# Patient Record
Sex: Male | Born: 1965 | Race: Black or African American | Hispanic: No | Marital: Single | Smoking: Current every day smoker
Health system: Southern US, Community
[De-identification: ages and names within clinical notes are randomized; demographics above are authoritative.]

## PROBLEM LIST (undated history)

## (undated) DIAGNOSIS — M549 Dorsalgia, unspecified: Secondary | ICD-10-CM

## (undated) DIAGNOSIS — I639 Cerebral infarction, unspecified: Secondary | ICD-10-CM

## (undated) DIAGNOSIS — E039 Hypothyroidism, unspecified: Secondary | ICD-10-CM

## (undated) DIAGNOSIS — E079 Disorder of thyroid, unspecified: Secondary | ICD-10-CM

## (undated) HISTORY — PX: HEMORRHOID SURGERY: SHX153

---

## 2006-07-22 ENCOUNTER — Ambulatory Visit: Payer: Self-pay | Admitting: Family Medicine

## 2006-07-22 ENCOUNTER — Ambulatory Visit: Payer: Self-pay | Admitting: *Deleted

## 2006-10-25 DIAGNOSIS — K644 Residual hemorrhoidal skin tags: Secondary | ICD-10-CM | POA: Insufficient documentation

## 2006-10-25 DIAGNOSIS — G510 Bell's palsy: Secondary | ICD-10-CM

## 2006-10-25 DIAGNOSIS — R634 Abnormal weight loss: Secondary | ICD-10-CM

## 2006-12-22 ENCOUNTER — Emergency Department (HOSPITAL_COMMUNITY): Admission: EM | Admit: 2006-12-22 | Discharge: 2006-12-22 | Payer: Self-pay | Admitting: Emergency Medicine

## 2006-12-29 ENCOUNTER — Emergency Department (HOSPITAL_COMMUNITY): Admission: EM | Admit: 2006-12-29 | Discharge: 2006-12-29 | Payer: Self-pay | Admitting: Emergency Medicine

## 2007-03-02 ENCOUNTER — Encounter: Admission: RE | Admit: 2007-03-02 | Discharge: 2007-03-02 | Payer: Self-pay | Admitting: General Surgery

## 2007-03-03 ENCOUNTER — Ambulatory Visit (HOSPITAL_BASED_OUTPATIENT_CLINIC_OR_DEPARTMENT_OTHER): Admission: RE | Admit: 2007-03-03 | Discharge: 2007-03-03 | Payer: Self-pay | Admitting: General Surgery

## 2007-03-03 ENCOUNTER — Encounter (INDEPENDENT_AMBULATORY_CARE_PROVIDER_SITE_OTHER): Payer: Self-pay | Admitting: General Surgery

## 2007-08-03 ENCOUNTER — Encounter (HOSPITAL_COMMUNITY): Admission: RE | Admit: 2007-08-03 | Discharge: 2007-11-01 | Payer: Self-pay | Admitting: Internal Medicine

## 2008-02-08 ENCOUNTER — Emergency Department (HOSPITAL_COMMUNITY): Admission: EM | Admit: 2008-02-08 | Discharge: 2008-02-09 | Payer: Self-pay | Admitting: Emergency Medicine

## 2008-11-01 IMAGING — NM NM RAI THERAPY FOR HYPERTHYROIDISM
1 series · 1 of 1 positions shown · non-contrast
Comparison: Nuclear medicine thyroid scan and uptake 08/04/2007

CLINICAL DATA: Hyperthyroidism

NUCLEAR MEDICINE RADIOACTIVE IODINE THERAPY FOR HYPERTHYROIDISM
TECHNIQUE: The risks and benefits of radioactive iodine therapy
were discussed with the patient in detail. Alternative therapies
were also mentioned. Radiation safety was discussed with the
patient, including how to protect the general public from exposure.
There were no barriers to communication.  Written consent was
obtained.  The patient then received a capsule containing the
radiopharmaceutical.  The patient will follow-up with the referring
physician.
Radiopharmaceutical: 30.7 mCi N-4H4

[Series 1: st statics,dual det. · 2.35mm/px · 1 of 1 slices shown]
[im 1/1]
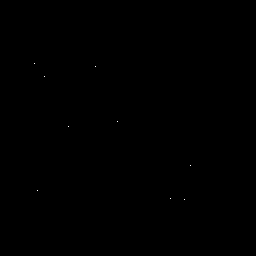

[1 of 1 positions shown; findings below may reference images not displayed]

FINDINGS: The patient received the capsule of N-4H4 after detailed
discussion of the treatment and precautions.
IMPRESSION: 1.  Treatment of hyperthyroidism with 30.7 mCi of N-4H4 orally.

## 2009-08-09 ENCOUNTER — Emergency Department (HOSPITAL_COMMUNITY): Admission: EM | Admit: 2009-08-09 | Discharge: 2009-08-09 | Payer: Self-pay | Admitting: Emergency Medicine

## 2010-01-27 ENCOUNTER — Encounter: Payer: Self-pay | Admitting: Internal Medicine

## 2010-02-13 ENCOUNTER — Emergency Department (HOSPITAL_COMMUNITY)
Admission: EM | Admit: 2010-02-13 | Discharge: 2010-02-13 | Disposition: A | Discharge status: Home / Self Care | Payer: Medicaid Other | Attending: Emergency Medicine | Admitting: Emergency Medicine

## 2010-02-13 ENCOUNTER — Emergency Department (HOSPITAL_COMMUNITY): Payer: Medicaid Other

## 2010-02-13 DIAGNOSIS — M545 Low back pain, unspecified: Secondary | ICD-10-CM | POA: Insufficient documentation

## 2010-02-13 DIAGNOSIS — X58XXXA Exposure to other specified factors, initial encounter: Secondary | ICD-10-CM | POA: Insufficient documentation

## 2010-02-13 DIAGNOSIS — G8929 Other chronic pain: Secondary | ICD-10-CM | POA: Insufficient documentation

## 2010-02-13 DIAGNOSIS — S335XXA Sprain of ligaments of lumbar spine, initial encounter: Secondary | ICD-10-CM | POA: Insufficient documentation

## 2010-02-13 LAB — URINALYSIS, ROUTINE W REFLEX MICROSCOPIC
Bilirubin Urine: NEGATIVE
Ketones, ur: NEGATIVE mg/dL
Nitrite: NEGATIVE
Protein, ur: NEGATIVE mg/dL

## 2010-03-21 LAB — POCT CARDIAC MARKERS
CKMB, poc: 1.8 ng/mL (ref 1.0–8.0)
Myoglobin, poc: 66.8 ng/mL (ref 12–200)
Troponin i, poc: <0.05 ng/mL (ref 0.00–0.09)

## 2010-03-21 LAB — POCT I-STAT, CHEM 8
Chloride: 109 mEq/L (ref 96–112)
Glucose, Bld: 97 mg/dL (ref 70–99)
Sodium: 139 mEq/L (ref 135–145)
TCO2: 19 mmol/L (ref 0–100)

## 2010-04-01 ENCOUNTER — Emergency Department (HOSPITAL_COMMUNITY): Payer: Medicaid Other

## 2010-04-01 ENCOUNTER — Emergency Department (HOSPITAL_COMMUNITY)
Admission: EM | Admit: 2010-04-01 | Discharge: 2010-04-01 | Disposition: A | Discharge status: Home / Self Care | Payer: Medicaid Other | Attending: Emergency Medicine | Admitting: Emergency Medicine

## 2010-04-01 DIAGNOSIS — M79609 Pain in unspecified limb: Secondary | ICD-10-CM | POA: Insufficient documentation

## 2010-04-01 DIAGNOSIS — M62838 Other muscle spasm: Secondary | ICD-10-CM | POA: Insufficient documentation

## 2010-04-01 DIAGNOSIS — R252 Cramp and spasm: Secondary | ICD-10-CM | POA: Insufficient documentation

## 2010-04-01 DIAGNOSIS — M545 Low back pain, unspecified: Secondary | ICD-10-CM | POA: Insufficient documentation

## 2010-04-01 DIAGNOSIS — R209 Unspecified disturbances of skin sensation: Secondary | ICD-10-CM | POA: Insufficient documentation

## 2010-04-02 ENCOUNTER — Emergency Department (HOSPITAL_COMMUNITY): Payer: Medicaid Other

## 2010-04-02 ENCOUNTER — Emergency Department (HOSPITAL_COMMUNITY)
Admission: EM | Admit: 2010-04-02 | Discharge: 2010-04-02 | Disposition: A | Discharge status: Home / Self Care | Payer: Medicaid Other | Attending: Emergency Medicine | Admitting: Emergency Medicine

## 2010-04-02 DIAGNOSIS — M79609 Pain in unspecified limb: Secondary | ICD-10-CM | POA: Insufficient documentation

## 2010-04-02 DIAGNOSIS — R209 Unspecified disturbances of skin sensation: Secondary | ICD-10-CM | POA: Insufficient documentation

## 2010-04-02 DIAGNOSIS — M545 Low back pain, unspecified: Secondary | ICD-10-CM | POA: Insufficient documentation

## 2010-04-02 DIAGNOSIS — IMO0002 Reserved for concepts with insufficient information to code with codable children: Secondary | ICD-10-CM | POA: Insufficient documentation

## 2010-04-22 LAB — BASIC METABOLIC PANEL
BUN: 12 mg/dL (ref 6–23)
Calcium: 10 mg/dL (ref 8.4–10.5)
Chloride: 101 mEq/L (ref 96–112)
GFR calc Af Amer: 60 mL/min — ABNORMAL LOW (ref >60)
Potassium: 4.1 mEq/L (ref 3.5–5.1)
Sodium: 138 mEq/L (ref 135–145)

## 2010-04-22 LAB — DIFFERENTIAL
Basophils Absolute: 0.1 10*3/uL (ref 0.0–0.1)
Eosinophils Absolute: 0.1 10*3/uL (ref 0.0–0.7)
Eosinophils Relative: 3 % (ref 0–5)
Lymphs Abs: 2.4 10*3/uL (ref 0.7–4.0)
Monocytes Absolute: 0.3 10*3/uL (ref 0.1–1.0)
Neutro Abs: 2.1 10*3/uL (ref 1.7–7.7)

## 2010-04-22 LAB — CBC
HCT: 44.2 % (ref 39.0–52.0)
MCHC: 33.6 g/dL (ref 30.0–36.0)
MCV: 92 fL (ref 78.0–100.0)
RDW: 15.3 % (ref 11.5–15.5)

## 2010-04-22 LAB — RAPID URINE DRUG SCREEN, HOSP PERFORMED
Barbiturates: NOT DETECTED
Opiates: NOT DETECTED

## 2010-04-22 LAB — ETHANOL: Alcohol, Ethyl (B): <5 mg/dL (ref 0–10)

## 2010-05-07 ENCOUNTER — Other Ambulatory Visit: Payer: Self-pay | Admitting: Neurosurgery

## 2010-05-07 DIAGNOSIS — M5126 Other intervertebral disc displacement, lumbar region: Secondary | ICD-10-CM

## 2010-05-07 DIAGNOSIS — M5416 Radiculopathy, lumbar region: Secondary | ICD-10-CM

## 2010-05-08 ENCOUNTER — Ambulatory Visit
Admission: RE | Admit: 2010-05-08 | Discharge: 2010-05-08 | Disposition: A | Discharge status: Home / Self Care | Payer: Medicaid Other | Source: Ambulatory Visit | Attending: Neurosurgery | Admitting: Neurosurgery

## 2010-05-08 DIAGNOSIS — M5416 Radiculopathy, lumbar region: Secondary | ICD-10-CM

## 2010-05-08 DIAGNOSIS — M5126 Other intervertebral disc displacement, lumbar region: Secondary | ICD-10-CM

## 2010-05-20 NOTE — Op Note (Signed)
NAMEWORTHINGTON, CRUZAN NO.:  [id number]   MEDICAL RECORD NO.:  [id number]          PATIENT TYPE:  AMB   LOCATION:  [facility]                         FACILITY:  [facility]   PHYSICIAN:  Anselm Pancoast. Weatherly, M.D.DATE OF BIRTH:  September 09, 1965   DATE OF PROCEDURE:  03/03/2007  DATE OF DISCHARGE:                               OPERATIVE REPORT   PREOPERATIVE DIAGNOSIS:  Prolapsing internal and external hemorrhoids.   OPERATION:  Internal and external hemorrhoidectomy and proctoscopy.   ANESTHESIA:  General.   POSITION:  Lithotomy.   HISTORY:  George Floyd is a 45 year old black male who has got bad  prolapsing internal and external hemorrhoids that bleed and are quite  painful.  He was seen in the office.  He has never had a colon exam, but  his hemorrhoids are enormous and chronically prolapsed and I recommend  that we do a proctoscopic exam and internal and external  hemorrhoidectomy and he is here for the planned procedure.  He is not  anemic.  He has got no other chronic medical problems and preoperatively  he was given a gram of Ancef.  Permit signed.  The patient had taken a  laxative last night, but had not got good results and I got them to give  him a Fleets enema prior to going back to the OR.  He said he got all of  the stew out with the Fleets enema.   DESCRIPTION OF PROCEDURE:  The patient was first placed in induction,  LAO tube, and then placed up in the lithotomy position and on rectal  exam there was no other stool in the rectum.  He was extremely  constipated when he was seen in the office.  I then did a proctoscopic  exam to 25 cm.  No abnormalities noted with the exception of the  internal and external hemorrhoids.  He was then prepped with Betadine  solution, draped in a sterile manner.  I anesthetized both the left and  right posterior area of the sphincter with 10 mL of 0.25 Marcaine with  adrenaline.  Then with the patient in a drape, I elected to go  ahead and  remove the anterior hemorrhoid that was to the right of midline first  and elevate it off of the internal sphincter in an external area of the  skin, and the underlying hemorrhoid was clamped with a Bowie clamp and  the hemorrhoid removed.  I then used about three 2-0 chromic sutures for  suturing the pedicle and clamped and then started at the apex, new 2-0  and then did a running suture removing the clip.  The Bowie clamp out to  the approximately first third and pulling everything snug.  Then with  the suture line completely closed, then went to the right posterolateral  next.  It was much larger than the first, elevating on the pedicle off  the sphincter, a few little sutures with 3-0 chromic on the external  portion of the communication, and then the similar 2-0 sutures on the  pedicle itself.   Next the left lateral was then  last.  There was 1 little hemorrhoid.  It  went to the midline posterior and I included this with it and then  suturing the pedicle with the 2-0 Vicryls, and then a running 2-0 Vicryl  for closure of the areas.  I did use a little bit of a T of the external  skin on the left lateral to keep the knots close to the anus, but also  to close the little area.  The inspection of all 3 suture lines was next  done with a small anoscope.  There was no evidence of bleeding, and then  I used 5% Xylocaine ointment in the anal canal, held in place with an  open 4 x 4 and then dressings held in place with a little stretch  bandage.   The patient tolerated the procedure well.  Hopefully he will not have  any trouble voiding and will be discharged with pain medication,  Vicodin, and also the Xylocaine ointment.  I will see him back in the  office in approximately  10 days.  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  All the specimens were sent for pathology  exam.  They  all looked just like chronic large hemorrhoids, nothing that looks  worrisome.           ______________________________  Anselm Pancoast. Zachery Dakins, M.D.     WJW/MEDQ  D:  03/03/2007  T:  03/03/2007  Job:  [id number]

## 2010-09-26 LAB — COMPREHENSIVE METABOLIC PANEL
AST: 19
Albumin: 3.8
Alkaline Phosphatase: 41
BUN: 3 — ABNORMAL LOW
CO2: 26
Chloride: 108
Creatinine, Ser: 0.92
GFR calc non Af Amer: >60
Glucose, Bld: 132 — ABNORMAL HIGH
Total Protein: 6.4

## 2010-09-26 LAB — DIFFERENTIAL
Basophils Relative: 1
Eosinophils Absolute: 0.1
Eosinophils Relative: 2
Lymphocytes Relative: 52 — ABNORMAL HIGH
Lymphs Abs: 2.2
Monocytes Absolute: 0.4
Monocytes Relative: 9
Neutro Abs: 1.6 — ABNORMAL LOW

## 2010-09-26 LAB — CBC
MCV: 90
WBC: 4.3

## 2012-04-12 ENCOUNTER — Emergency Department (HOSPITAL_COMMUNITY)
Admission: EM | Admit: 2012-04-12 | Discharge: 2012-04-12 | Disposition: A | Discharge status: Home / Self Care | Payer: Medicaid Other | Attending: Emergency Medicine | Admitting: Emergency Medicine

## 2012-04-12 ENCOUNTER — Encounter (HOSPITAL_COMMUNITY): Payer: Self-pay | Admitting: Emergency Medicine

## 2012-04-12 ENCOUNTER — Emergency Department (HOSPITAL_COMMUNITY): Payer: Medicaid Other

## 2012-04-12 DIAGNOSIS — Z8639 Personal history of other endocrine, nutritional and metabolic disease: Secondary | ICD-10-CM | POA: Insufficient documentation

## 2012-04-12 DIAGNOSIS — R112 Nausea with vomiting, unspecified: Secondary | ICD-10-CM | POA: Insufficient documentation

## 2012-04-12 DIAGNOSIS — N201 Calculus of ureter: Secondary | ICD-10-CM | POA: Insufficient documentation

## 2012-04-12 DIAGNOSIS — K5900 Constipation, unspecified: Secondary | ICD-10-CM | POA: Insufficient documentation

## 2012-04-12 DIAGNOSIS — F17200 Nicotine dependence, unspecified, uncomplicated: Secondary | ICD-10-CM | POA: Insufficient documentation

## 2012-04-12 DIAGNOSIS — N1330 Unspecified hydronephrosis: Secondary | ICD-10-CM | POA: Insufficient documentation

## 2012-04-12 DIAGNOSIS — N132 Hydronephrosis with renal and ureteral calculous obstruction: Secondary | ICD-10-CM

## 2012-04-12 DIAGNOSIS — Z862 Personal history of diseases of the blood and blood-forming organs and certain disorders involving the immune mechanism: Secondary | ICD-10-CM | POA: Insufficient documentation

## 2012-04-12 DIAGNOSIS — R10817 Generalized abdominal tenderness: Secondary | ICD-10-CM | POA: Insufficient documentation

## 2012-04-12 HISTORY — DX: Disorder of thyroid, unspecified: E07.9

## 2012-04-12 LAB — COMPREHENSIVE METABOLIC PANEL
ALT: 18 U/L (ref 0–53)
AST: 27 U/L (ref 0–37)
CO2: 25 mEq/L (ref 19–32)
GFR calc Af Amer: 76 mL/min — ABNORMAL LOW (ref >90)
Potassium: 3.9 mEq/L (ref 3.5–5.1)
Total Bilirubin: 0.4 mg/dL (ref 0.3–1.2)
Total Protein: 7 g/dL (ref 6.0–8.3)

## 2012-04-12 LAB — CBC WITH DIFFERENTIAL/PLATELET
Eosinophils Absolute: 0.1 10*3/uL (ref 0.0–0.7)
Eosinophils Relative: 2 % (ref 0–5)
Lymphs Abs: 1.5 10*3/uL (ref 0.7–4.0)
Neutro Abs: 4.6 10*3/uL (ref 1.7–7.7)
Platelets: 228 10*3/uL (ref 150–400)
RBC: 3.99 MIL/uL — ABNORMAL LOW (ref 4.22–5.81)
RDW: 15.7 % — ABNORMAL HIGH (ref 11.5–15.5)
WBC: 6.8 10*3/uL (ref 4.0–10.5)

## 2012-04-12 LAB — URINALYSIS, MICROSCOPIC ONLY
Protein, ur: 30 mg/dL — AB
Specific Gravity, Urine: 1.029 (ref 1.005–1.030)

## 2012-04-12 LAB — LIPASE, BLOOD: Lipase: 45 U/L (ref 11–59)

## 2012-04-12 MED ORDER — TAMSULOSIN HCL 0.4 MG PO CAPS: 0.4000 mg | ORAL_CAPSULE | Freq: Every day | ORAL | Status: DC

## 2012-04-12 MED ORDER — ONDANSETRON 4 MG PO TBDP
4.0000 mg | ORAL_TABLET | Freq: Once | ORAL | Status: AC
  Administered 2012-04-12: 4 mg via ORAL
  Filled 2012-04-12: qty 1

## 2012-04-12 MED ORDER — OXYCODONE-ACETAMINOPHEN 5-325 MG PO TABS: 1.0000 | ORAL_TABLET | ORAL | Status: DC | PRN

## 2012-04-12 MED ORDER — SODIUM CHLORIDE 0.9 % IV BOLUS (SEPSIS)
1000.0000 mL | Freq: Once | INTRAVENOUS | Status: AC
  Administered 2012-04-12: 1000 mL via INTRAVENOUS

## 2012-04-12 MED ORDER — ONDANSETRON HCL 4 MG PO TABS: 4.0000 mg | ORAL_TABLET | Freq: Four times a day (QID) | ORAL | Status: DC

## 2012-04-12 MED ORDER — HYDROMORPHONE HCL PF 1 MG/ML IJ SOLN
1.0000 mg | Freq: Once | INTRAMUSCULAR | Status: AC
  Administered 2012-04-12: 1 mg via INTRAVENOUS
  Filled 2012-04-12: qty 1

## 2012-04-12 NOTE — ED Notes (Signed)
Pt states that he has not been able to have a BM in 2 days.  C/o rt sided abd pain.  Pt is moaning and grunting in pain during triage.

## 2012-04-12 NOTE — ED Provider Notes (Signed)
History     CSN: [id number]  Arrival date & time 04/12/12  0706   First MD Initiated Contact with Patient 04/12/12 [phone]      Chief Complaint  Patient presents with  . Constipation  . Abdominal Pain    (Consider location/radiation/quality/duration/timing/severity/associated sxs/prior treatment) HPI  Patient presents to the ED with complaints of severe diffuse abdominal pain that is worse in the RLQ. It started acutely this morning. He has not had a bowel movement in two days and has been able to pass a small amount of gas. The pain is constant and a "spasming" quality causing him to not be able to lay flat. He had one episode of vomiting this morning after taking a stool softner. He has never had this kind of pain before, no previous abdominal surgeries, no fevers, diarrhea, weakness, recent illness, CP, SOB, rectal bleeding, hematuria, testicular pain. vss  Past Medical History  Diagnosis Date  . Thyroid disease     History reviewed. No pertinent past surgical history.  History reviewed. No pertinent family history.  History  Substance Use Topics  . Smoking status: Current Every Day Smoker -- 0.50 packs/day    Types: Cigarettes  . Smokeless tobacco: Not on file  . Alcohol Use: No      Review of Systems  Review of Systems  Gen: no weight loss, fevers, chills, night sweats  Eyes: no discharge or drainage, no occular pain or visual changes  Nose: no epistaxis or rhinorrhea  Mouth: no dental pain, no sore throat  Neck: no neck pain  Lungs:No wheezing, coughing or hemoptysis CV: no chest pain, palpitations, dependent edema or orthopnea  Abd: + abdominal pain, nausea, vomiting  GU: no dysuria or gross hematuria  MSK:  No abnormalities  Neuro: no headache, no focal neurologic deficits  Skin: no abnormalities Psyche: negative.   Allergies  Review of patient's allergies indicates no known allergies.  Home Medications   Current Outpatient Rx  Name  Route  Sig   Dispense  Refill  . ondansetron (ZOFRAN) 4 MG tablet   Oral   Take 1 tablet (4 mg total) by mouth every 6 (six) hours.   12 tablet   0   . oxyCODONE-acetaminophen (PERCOCET/ROXICET) 5-325 MG per tablet   Oral   Take 1 tablet by mouth every 4 (four) hours as needed for pain.   20 tablet   0   . tamsulosin (FLOMAX) 0.4 MG CAPS   Oral   Take 1 capsule (0.4 mg total) by mouth daily.   14 capsule   0     BP 125/62  Pulse 50  Temp(Src) 97.7 F (36.5 C) (Oral)  Resp 18  SpO2 100%  Physical Exam  Nursing note and vitals reviewed. Constitutional: He appears well-developed and well-nourished. No distress.  HENT:  Head: Normocephalic and atraumatic.  Eyes: Pupils are equal, round, and reactive to light.  Neck: Normal range of motion. Neck supple.  Cardiovascular: Normal rate and regular rhythm.   Pulmonary/Chest: Effort normal.  Abdominal: Soft. He exhibits no shifting dullness, no distension, no pulsatile liver, no fluid wave and no ascites. There is tenderness (diffuse abdominal pain that is worse RLQ ). There is guarding (invuluntary guarding). There is no rigidity.  Neurological: He is alert.  Skin: Skin is warm and dry.    ED Course  Procedures (including critical care time)  Labs Reviewed  CBC WITH DIFFERENTIAL - Abnormal; Notable for the following:    RBC 3.99 (*)  Hemoglobin 12.1 (*)    HCT 35.0 (*)    RDW 15.7 (*)    All other components within normal limits  COMPREHENSIVE METABOLIC PANEL - Abnormal; Notable for the following:    Glucose, Bld 116 (*)    Alkaline Phosphatase 33 (*)    GFR calc non Af Amer 66 (*)    GFR calc Af Amer 76 (*)    All other components within normal limits  URINALYSIS, MICROSCOPIC ONLY - Abnormal; Notable for the following:    Hgb urine dipstick MODERATE (*)    Protein, ur 30 (*)    Leukocytes, UA SMALL (*)    Crystals CA OXALATE CRYSTALS (*)    All other components within normal limits  URINE CULTURE  LIPASE, BLOOD   Ct  Abdomen Pelvis Wo Contrast  04/12/2012  *RADIOLOGY REPORT*  Clinical Data: Constipation and abdominal pain.  Right lateral abdominal pain  CT ABDOMEN AND PELVIS WITHOUT CONTRAST  Technique:  Multidetector CT imaging of the abdomen and pelvis was performed following the standard protocol without intravenous contrast.  Comparison: Abdominal series 04/12/2012  Findings: Lung bases demonstrate mild atelectasis.  Unenhanced CT was performed per clinician order.  Lack of IV contrast limits sensitivity and specificity, especially for evaluation of abdominal/pelvic solid viscera.  No gross abnormality to the liver, gallbladder, spleen, adrenal glands or pancreas.  There is fullness of the left renal pelvis and suggestion for a bifid collecting system.  However, this probably represents an extrarenal pelvis rather than hydronephrosis.  Subtle medullary densities in the left kidney without stones.  There may be a punctate stone within the right kidney lower pole.  There is dilatation of the right renal collecting system including the calyces.  The right ureter is dilated due to a large stone in the distal right ureter.  The stone measures up to 9 mm on the coronal images.  Limited evaluation of the urinary bladder. No gross abnormality to the prostate or seminal vesicles.  No significant free fluid or lymphadenopathy.  The bone images demonstrate areas of subchondral sclerosis involving the femoral heads, right side greater left.  Findings could represent changes from avascular necrosis.  No evidence for femoral head collapse.  Probable disc bulge at L3-4.  IMPRESSION: Moderate-severe right hydroureteronephrosis due to a 9 mm stone in the distal right ureter.  Punctate stone in the right kidney lower pole.  Areas of sclerosis involving the femoral heads are concerning for avascular necrosis.   Original Report Authenticated By: Richarda Overlie, M.D.    Dg Abd Acute W/chest  04/12/2012  *RADIOLOGY REPORT*  Clinical Data:  Constipation, abdominal pain, nausea/vomiting  ACUTE ABDOMEN SERIES (ABDOMEN 2 VIEW & CHEST 1 VIEW)  Comparison: 08/09/2009  Findings: Lungs are clear. No pleural effusion or pneumothorax.  Cardiomediastinal silhouette is within normal limits.  Nonspecific bowel gas pattern, but without disproportionate small bowel dilatation to suggest small bowel obstruction.  No evidence of free air under the diaphragm on the upright view.  Moderate stool in the right colon.  IMPRESSION: No evidence of acute cardiopulmonary disease.  No evidence of small bowel obstruction or free air.  Moderate stool in the right colon.   Original Report Authenticated By: Charline Bills, M.D.      1. Ureteral stone with hydronephrosis       MDM  Patient has been seen by Dr. Hyacinth Meeker as well. His urine shows moderate hbg. Will order CT abd/pelv wo contrast.  CT abd/pelv shows a 9 mm stone to the  distal right ureter with mod/severe right hydroureterernephrosis in the distal right ureter.   CT also notes possible avascular necrosis involving femoral heads. Patient not having symptoms consistent with this but will give referral to Ortho.   Consult placed to Urology.  He wants to see pt in his office for follow-up. Pt given referral. Discussed plan with patient and his aunt.  Will rx: urine strainer, pain medication, flomax and zofran. No UTI/    Pt has been advised of the symptoms that warrant their return to the ED. Patient has voiced understanding and has agreed to follow-up with the PCP or specialist.      Dorthula Matas, PA-C 04/12/12 [phone]

## 2012-04-12 NOTE — ED Provider Notes (Signed)
47 year old male, history of thyroid disorder, presents with 2 days of abdominal pain, decreased bowel movements and decreased urination. On exam the patient has diffuse abdominal pain, mild guarding, abdomen is non-peritoneal, no focal tenderness, pain in both right and left lower quadrants, right and left upper quadrants and periumbilical area. He denies a history of using over-the-counter medication such as NSAIDs or aspirin-containing products  Acute abdominal series to rule out free air, stool burden, CT scan may be necessary and no obvious source, labs, urine   Medical screening examination/treatment/procedure(s) were conducted as a shared visit with non-physician practitioner(s) and myself.  I personally evaluated the patient during the encounter    Vida Roller, MD 04/12/12 [phone]

## 2012-04-12 NOTE — ED Provider Notes (Signed)
Medical screening examination/treatment/procedure(s) were conducted as a shared visit with non-physician practitioner(s) and myself.  I personally evaluated the patient during the encounter  Please see my separate respective documentation pertaining to this patient encounter   Vida Roller, MD 04/12/12 [phone]

## 2012-04-13 LAB — URINE CULTURE: Colony Count: NO GROWTH

## 2012-04-14 ENCOUNTER — Emergency Department (HOSPITAL_COMMUNITY)
Admission: EM | Admit: 2012-04-14 | Discharge: 2012-04-14 | Disposition: A | Discharge status: Home / Self Care | Payer: Medicaid Other | Attending: Emergency Medicine | Admitting: Emergency Medicine

## 2012-04-14 ENCOUNTER — Encounter (HOSPITAL_COMMUNITY): Payer: Self-pay | Admitting: *Deleted

## 2012-04-14 DIAGNOSIS — Z862 Personal history of diseases of the blood and blood-forming organs and certain disorders involving the immune mechanism: Secondary | ICD-10-CM | POA: Insufficient documentation

## 2012-04-14 DIAGNOSIS — K5900 Constipation, unspecified: Secondary | ICD-10-CM | POA: Insufficient documentation

## 2012-04-14 DIAGNOSIS — Z8673 Personal history of transient ischemic attack (TIA), and cerebral infarction without residual deficits: Secondary | ICD-10-CM | POA: Insufficient documentation

## 2012-04-14 DIAGNOSIS — Z8639 Personal history of other endocrine, nutritional and metabolic disease: Secondary | ICD-10-CM | POA: Insufficient documentation

## 2012-04-14 DIAGNOSIS — N2889 Other specified disorders of kidney and ureter: Secondary | ICD-10-CM | POA: Insufficient documentation

## 2012-04-14 DIAGNOSIS — N200 Calculus of kidney: Secondary | ICD-10-CM

## 2012-04-14 DIAGNOSIS — F17200 Nicotine dependence, unspecified, uncomplicated: Secondary | ICD-10-CM | POA: Insufficient documentation

## 2012-04-14 DIAGNOSIS — N138 Other obstructive and reflux uropathy: Secondary | ICD-10-CM

## 2012-04-14 LAB — BASIC METABOLIC PANEL
CO2: 26 mEq/L (ref 19–32)
Chloride: 101 mEq/L (ref 96–112)
Creatinine, Ser: 1.9 mg/dL — ABNORMAL HIGH (ref 0.50–1.35)

## 2012-04-14 LAB — CBC
MCV: 87.9 fL (ref 78.0–100.0)
Platelets: 267 10*3/uL (ref 150–400)
RBC: 3.8 MIL/uL — ABNORMAL LOW (ref 4.22–5.81)
WBC: 5.3 10*3/uL (ref 4.0–10.5)

## 2012-04-14 MED ORDER — HYDROMORPHONE HCL PF 1 MG/ML IJ SOLN
1.0000 mg | Freq: Once | INTRAMUSCULAR | Status: AC
  Administered 2012-04-14: 1 mg via INTRAVENOUS
  Filled 2012-04-14: qty 1

## 2012-04-14 MED ORDER — POLYETHYLENE GLYCOL 3350 17 G PO PACK: 17.0000 g | PACK | Freq: Every day | ORAL | Status: DC | PRN

## 2012-04-14 MED ORDER — POLYETHYLENE GLYCOL 3350 17 G PO PACK
17.0000 g | PACK | Freq: Every day | ORAL | Status: DC
  Administered 2012-04-14: 17 g via ORAL
  Filled 2012-04-14 (×2): qty 1

## 2012-04-14 MED ORDER — OXYCODONE-ACETAMINOPHEN 5-325 MG PO TABS: 2.0000 | ORAL_TABLET | ORAL | Status: DC | PRN

## 2012-04-14 MED ORDER — ACETAMINOPHEN 500 MG PO TABS
1000.0000 mg | ORAL_TABLET | Freq: Once | ORAL | Status: AC
  Administered 2012-04-14: 1000 mg via ORAL
  Filled 2012-04-14: qty 2

## 2012-04-14 NOTE — ED Notes (Signed)
Pt given urinal aware urine sample is needed

## 2012-04-14 NOTE — ED Notes (Signed)
Pharmacy called, still waiting on Miralax.

## 2012-04-14 NOTE — ED Notes (Signed)
[id number]<AV> Expected date:<BR> Expected time:<BR> Means of arrival:<BR> Comments:<BR> Tr 7

## 2012-04-14 NOTE — ED Notes (Signed)
Pt was seen in Ed on Monday. Pt was dx with kidney stones. Pt now c/o right sided flank pain. Pt states he has been taking medication to help relieve pain to no avail.

## 2012-04-14 NOTE — ED Provider Notes (Signed)
History     CSN: [id number]  Arrival date & time 04/14/12  1950   None     Chief Complaint  Patient presents with  . Flank Pain    (Consider location/radiation/quality/duration/timing/severity/associated sxs/prior treatment) HPI 47 yo M with thyroid disease presenting with right flank pain. He was seen in the ED on Monday and found to have a 9mm right sided kidney stone.  He was discharged with percocet, flomax, and zofran.  He reports taking the medication as prescribed, although he does not know the names of the medicines, with no improvement in the pain.  He also complains of constipation, did have a runny BM yesterday, but none today.  Denies dysuria, urgency, frequency.  No fevers, chills, abdominal pain.  No nausea or vomiting.  He reports that he called urology and has an appt with them next Thursday.  Past Medical History  Diagnosis Date  . Thyroid disease     History reviewed. No pertinent past surgical history.  History reviewed. No pertinent family history.  History  Substance Use Topics  . Smoking status: Current Every Day Smoker -- 0.50 packs/day    Types: Cigarettes  . Smokeless tobacco: Not on file  . Alcohol Use: No      Review of Systems  Gastrointestinal: Positive for constipation.  Genitourinary: Positive for flank pain (right).  All other systems reviewed and are negative.    Allergies  Review of patient's allergies indicates no known allergies.  Home Medications   Current Outpatient Rx  Name  Route  Sig  Dispense  Refill  . ondansetron (ZOFRAN) 4 MG tablet   Oral   Take 1 tablet (4 mg total) by mouth every 6 (six) hours.   12 tablet   0   . oxyCODONE-acetaminophen (PERCOCET/ROXICET) 5-325 MG per tablet   Oral   Take 1 tablet by mouth every 4 (four) hours as needed for pain.   20 tablet   0   . tamsulosin (FLOMAX) 0.4 MG CAPS   Oral   Take 1 capsule (0.4 mg total) by mouth daily.   14 capsule   0     BP 128/81  Pulse 72   Temp(Src) 97.6 F (36.4 C) (Oral)  Resp 16  SpO2 100%  Physical Exam  Constitutional: He is oriented to person, place, and time. He appears well-developed and well-nourished. He appears distressed (unable to lie still on bed).  HENT:  Head: Normocephalic and atraumatic.  Eyes: No scleral icterus.  Neck: Normal range of motion. Neck supple.  Cardiovascular: Normal rate, regular rhythm, normal heart sounds and intact distal pulses.   No murmur heard. Pulmonary/Chest: Effort normal and breath sounds normal. No respiratory distress. He has no wheezes. He has no rales.  Abdominal: Soft. Bowel sounds are normal. He exhibits no distension. There is tenderness (RLQ). There is no rebound and no guarding.  Musculoskeletal: He exhibits no edema.  Neurological: He is alert and oriented to person, place, and time. He exhibits normal muscle tone.  Skin: Skin is warm and dry. No rash noted. He is not diaphoretic. No erythema.    ED Course  Procedures (including critical care time)  Labs Reviewed  CBC - Abnormal; Notable for the following:    RBC 3.80 (*)    Hemoglobin 11.5 (*)    HCT 33.4 (*)    RDW 15.9 (*)    All other components within normal limits  BASIC METABOLIC PANEL - Abnormal; Notable for the following:    Creatinine,  Ser 1.90 (*)    Calcium 8.3 (*)    GFR calc non Af Amer 40 (*)    GFR calc Af Amer 47 (*)    All other components within normal limits  URINALYSIS, ROUTINE W REFLEX MICROSCOPIC   No results found.   No diagnosis found.    MDM  47 yo M with thyroid disease and known right 9mm kidney stone diagnosed by CT on Monday.  Poor pain control at home with percocet.  Will check cbc and bmp.  No signs to indicate developing urinary infection but will check ua.  Will give dilaudid 1mg  IV.   9:14 PM: BMP shows acute elevation of creatinine to 1.90.  Will consult urology.  Discussed with Dr. Vernie Ammons who recommends pain control with narcotics and tylenol.  If pain can be  controlled okay to discharge with f/u in Urology clinic tomorrow.  9:36 PM: Discussed results extensively with both the patient and his aunt Kathie Rhodes.  They are both very concerned about constipation and some swelling around his eyes.  I explained that the pain in his abdomen is from the kidney stone, NOT constipation.  Similarly, the swelling is from the kidney stone causing some difficulty with fluid removal.  Explained that we need to treat the kidney stone and the other concerns should improve on their own.  Given the fixation on constipation, will give some miralax.  Pain was improved to a 5/10 with dilaudid 1mg . Will give another 1mg  of dilaudid.   Plan to discharge on flomax and with percocet 5-325mg  take 2 tablets q4hr overnight.  Office information provided in discharge paperwork.  Emphasized importance of seeing Urology tomorrow.  Phebe Colla, MD 04/14/12 2152

## 2012-04-14 NOTE — ED Provider Notes (Signed)
I saw and evaluated the patient, reviewed the resident's note and I agree with the findings and plan.   .Face to face Exam:  General:  Awake HEENT:  Atraumatic Resp:  Normal effort Abd:  Nondistended Neuro:No focal weakness   Nelia Shi, MD 04/14/12 2206

## 2012-04-15 ENCOUNTER — Encounter (HOSPITAL_COMMUNITY): Payer: Self-pay | Admitting: *Deleted

## 2012-04-15 ENCOUNTER — Encounter (HOSPITAL_COMMUNITY)
Admission: AD | Disposition: A | Discharge status: Home / Self Care | Payer: Self-pay | Source: Ambulatory Visit | Attending: Urology

## 2012-04-15 ENCOUNTER — Other Ambulatory Visit: Payer: Self-pay | Admitting: Urology

## 2012-04-15 ENCOUNTER — Ambulatory Visit (HOSPITAL_COMMUNITY): Payer: Medicaid Other | Admitting: *Deleted

## 2012-04-15 ENCOUNTER — Ambulatory Visit (HOSPITAL_COMMUNITY)
Admission: AD | Admit: 2012-04-15 | Discharge: 2012-04-15 | Disposition: A | Discharge status: Home / Self Care | Payer: Medicaid Other | Source: Ambulatory Visit | Attending: Urology | Admitting: Urology

## 2012-04-15 DIAGNOSIS — F17200 Nicotine dependence, unspecified, uncomplicated: Secondary | ICD-10-CM | POA: Insufficient documentation

## 2012-04-15 DIAGNOSIS — I739 Peripheral vascular disease, unspecified: Secondary | ICD-10-CM | POA: Insufficient documentation

## 2012-04-15 DIAGNOSIS — N201 Calculus of ureter: Secondary | ICD-10-CM | POA: Insufficient documentation

## 2012-04-15 DIAGNOSIS — Z79899 Other long term (current) drug therapy: Secondary | ICD-10-CM | POA: Insufficient documentation

## 2012-04-15 DIAGNOSIS — E079 Disorder of thyroid, unspecified: Secondary | ICD-10-CM | POA: Insufficient documentation

## 2012-04-15 HISTORY — PX: CYSTOSCOPY/RETROGRADE/URETEROSCOPY/STONE EXTRACTION WITH BASKET: SHX5317

## 2012-04-15 HISTORY — PX: HOLMIUM LASER APPLICATION: SHX5852

## 2012-04-15 HISTORY — DX: Cerebral infarction, unspecified: I63.9

## 2012-04-15 LAB — SURGICAL PCR SCREEN
MRSA, PCR: NEGATIVE
Staphylococcus aureus: POSITIVE — AB

## 2012-04-15 SURGERY — CYSTOSCOPY, WITH CALCULUS REMOVAL USING BASKET
Anesthesia: General | Site: Ureter | Laterality: Right | Wound class: Clean Contaminated

## 2012-04-15 MED ORDER — MIDAZOLAM HCL 5 MG/5ML IJ SOLN
INTRAMUSCULAR | Status: DC | PRN
  Administered 2012-04-15 (×2): 1 mg via INTRAVENOUS

## 2012-04-15 MED ORDER — LACTATED RINGERS IV SOLN
INTRAVENOUS | Status: DC | PRN
  Administered 2012-04-15: 20:00:00 via INTRAVENOUS

## 2012-04-15 MED ORDER — IOHEXOL 300 MG/ML  SOLN
INTRAMUSCULAR | Status: AC
  Filled 2012-04-15: qty 1

## 2012-04-15 MED ORDER — CIPROFLOXACIN HCL 500 MG PO TABS: 500.0000 mg | ORAL_TABLET | Freq: Two times a day (BID) | ORAL | Status: DC

## 2012-04-15 MED ORDER — LIDOCAINE HCL (CARDIAC) 20 MG/ML IV SOLN
INTRAVENOUS | Status: DC | PRN
  Administered 2012-04-15: 75 mg via INTRAVENOUS

## 2012-04-15 MED ORDER — SODIUM CHLORIDE 0.9 % IR SOLN
Status: DC | PRN
  Administered 2012-04-15: 3000 mL

## 2012-04-15 MED ORDER — URIBEL 118 MG PO CAPS: 1.0000 | ORAL_CAPSULE | Freq: Three times a day (TID) | ORAL | Status: DC | PRN

## 2012-04-15 MED ORDER — ACETAMINOPHEN 10 MG/ML IV SOLN
INTRAVENOUS | Status: AC
  Filled 2012-04-15: qty 100

## 2012-04-15 MED ORDER — ACETAMINOPHEN 10 MG/ML IV SOLN
INTRAVENOUS | Status: DC | PRN
  Administered 2012-04-15: 1000 mg via INTRAVENOUS

## 2012-04-15 MED ORDER — MUPIROCIN 2 % EX OINT
TOPICAL_OINTMENT | Freq: Two times a day (BID) | CUTANEOUS | Status: DC
  Filled 2012-04-15: qty 22

## 2012-04-15 MED ORDER — ONDANSETRON HCL 4 MG/2ML IJ SOLN
INTRAMUSCULAR | Status: DC | PRN
  Administered 2012-04-15 (×2): 2 mg via INTRAVENOUS

## 2012-04-15 MED ORDER — CEFAZOLIN SODIUM-DEXTROSE 2-3 GM-% IV SOLR
INTRAVENOUS | Status: DC | PRN
  Administered 2012-04-15: 2 g via INTRAVENOUS

## 2012-04-15 MED ORDER — CEFAZOLIN SODIUM-DEXTROSE 2-3 GM-% IV SOLR
INTRAVENOUS | Status: AC
  Filled 2012-04-15: qty 50

## 2012-04-15 MED ORDER — FENTANYL CITRATE 0.05 MG/ML IJ SOLN
INTRAMUSCULAR | Status: DC | PRN
  Administered 2012-04-15: 25 ug via INTRAVENOUS

## 2012-04-15 MED ORDER — PROPOFOL 10 MG/ML IV EMUL
INTRAVENOUS | Status: DC | PRN
  Administered 2012-04-15: 25 mg via INTRAVENOUS
  Administered 2012-04-15: 200 mg via INTRAVENOUS

## 2012-04-15 MED ORDER — IOHEXOL 300 MG/ML  SOLN
INTRAMUSCULAR | Status: DC | PRN
  Administered 2012-04-15: 5 mL

## 2012-04-15 SURGICAL SUPPLY — 24 items
ADAPTER CATH URET PLST 4-6FR (CATHETERS) ×2 IMPLANT
ADPR CATH URET STRL DISP 4-6FR (CATHETERS) ×1
BAG URO CATCHER STRL LF (DRAPE) ×2 IMPLANT
BASKET ZERO TIP NITINOL 2.4FR (BASKET) ×1 IMPLANT
BSKT STON RTRVL ZERO TP 2.4FR (BASKET) ×1
CATH INTERMIT  6FR 70CM (CATHETERS) ×1 IMPLANT
CATH URET 5FR 28IN OPEN ENDED (CATHETERS) ×2 IMPLANT
CLOTH BEACON ORANGE TIMEOUT ST (SAFETY) ×2 IMPLANT
DRAPE CAMERA CLOSED 9X96 (DRAPES) ×2 IMPLANT
GLOVE BIOGEL M STRL SZ7.5 (GLOVE) ×2 IMPLANT
GLOVE SURG SS PI 8.0 STRL IVOR (GLOVE) ×2 IMPLANT
GOWN PREVENTION PLUS XLARGE (GOWN DISPOSABLE) ×2 IMPLANT
GOWN STRL NON-REIN LRG LVL3 (GOWN DISPOSABLE) ×4 IMPLANT
GOWN STRL REIN XL XLG (GOWN DISPOSABLE) ×2 IMPLANT
GUIDEWIRE ANG ZIPWIRE 038X150 (WIRE) ×1 IMPLANT
GUIDEWIRE STR DUAL SENSOR (WIRE) ×2 IMPLANT
LASER FIBER DISP (UROLOGICAL SUPPLIES) ×1 IMPLANT
MANIFOLD NEPTUNE II (INSTRUMENTS) ×2 IMPLANT
MARKER SKIN DUAL TIP RULER LAB (MISCELLANEOUS) ×2 IMPLANT
PACK CYSTO (CUSTOM PROCEDURE TRAY) ×2 IMPLANT
SHEATH URET ACCESS 12FR/35CM (UROLOGICAL SUPPLIES) ×1 IMPLANT
STENT CONTOUR 6FRX26X.038 (STENTS) ×1 IMPLANT
SYRINGE IRR TOOMEY STRL 70CC (SYRINGE) ×1 IMPLANT
TUBING CONNECTING 10 (TUBING) ×2 IMPLANT

## 2012-04-15 NOTE — Anesthesia Preprocedure Evaluation (Signed)
Anesthesia Evaluation  Patient identified by MRN, date of birth, ID band Patient awake    Reviewed: Allergy & Precautions, H&P , Patient's Chart, lab work & pertinent test results, reviewed documented beta blocker date and time   History of Anesthesia Complications Negative for: history of anesthetic complications  Airway Mallampati: II TM Distance: >3 FB Neck ROM: full    Dental no notable dental hx.    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   Dental Advisory Given  Plan Discussed with: CRNA, Surgeon and Anesthesiologist  Anesthesia Plan Comments:         Anesthesia Quick Evaluation

## 2012-04-15 NOTE — Interval H&P Note (Signed)
History and Physical Interval Note:  04/15/2012 7:52 PM  George Floyd  has presented today for surgery, with the diagnosis of right ureteral obstruction  The various methods of treatment have been discussed with the patient and family. After consideration of risks, benefits and other options for treatment, the patient has consented to  Procedure(s): CYSTOSCOPY/RETROGRADE/URETEROSCOPY/STONE EXTRACTION WITH BASKET (Right) HOLMIUM LASER APPLICATION (Right) as a surgical intervention .  The patient's history has been reviewed, patient examined, no change in status, stable for surgery.  I have reviewed the patient's chart and labs.  Questions were answered to the patient's satisfaction.     Chelsea Aus

## 2012-04-15 NOTE — Anesthesia Postprocedure Evaluation (Signed)
  Anesthesia Post-op Note  Patient: George Floyd  Procedure(s) Performed: Procedure(s): CYSTOSCOPY/RETROGRADE/URETEROSCOPY/STONE EXTRACTION WITH BASKET (Right) HOLMIUM LASER APPLICATION (Right)  Patient Location: PACU  Anesthesia Type:General  Level of Consciousness: awake, alert , oriented and patient cooperative  Airway and Oxygen Therapy: Patient Spontanous Breathing  Post-op Pain: none  Post-op Assessment: Post-op Vital signs reviewed  Post-op Vital Signs: Reviewed and stable  Complications: No apparent anesthesia complications

## 2012-04-15 NOTE — Op Note (Signed)
Preoperative diagnosis: Symptomatic 9 mm right distal ureteral stone  Postoperative diagnosis: Same   Procedure: Cystoscopy, right retrograde ureteropyelogram, right ureteroscopy, holmium laser fragmentation and extraction of right ureteral stone, interpretive fluoroscopy, placement of 6 French by 26 cm contour double-J stent with tether    Surgeon: Bertram Millard. Kla Bily, M.D.   Anesthesia: Gen.   Complications: None  Specimen(s): None  Drain(s): 6 [place] by 26 cm contour stent with tether  Indications: 47 year old male recently presented to the office earlier today with a significantly symptomatic 9 mm right distal ureteral stone. This was his third presentation for medical treatment in 4 days. Because of the significant pain and inability to be taken care of with pain medicine at home, it was recommended by me and requested by the patient that stone management be done today. Risks and complications of ureteroscopy and laser extraction of stone were discussed. He understands these and desires to proceed.    Technique and findings: The patient was properly identified in the holding area. He received 2 g of IV Ancef, surgical site was properly marked. He was taken to the operating room where general anesthetic was administered with the LMA. He was placed in the dorsolithotomy position. Genitalia and perineum were prepped and draped. Proper timeout was then performed.  I then passed a 22 French panendoscope into his bladder through his urethra. Urethra was normal, without strictures. Prostate was nonobstructive. His bladder was entered and inspected circumferentially. No tumors trabeculations or foreign bodies were noted. Both ureteral orifices were normal in configuration and location. Using a 6 [place] open-ended catheter, I eventually negotiated a guidewire more proximally, which was difficult to get past the obstructing distal ureteral stone. Eventually, retrograde was performed showing  significant obstruction of the stone was some extravasation around the stone of the contrast. At this point, I removed the guidewire and catheter, and used a 6 French rigid ureteroscope to negotiate the urethra and into the distal ureter. I easily went up to the obstructing stone which is approximately 4 cm up. I negotiated the guidewire by the stone, with good curl seen in the renal pelvis. I then removed the ureteroscope, and dilated the distal ureter with a 15 French ureteral access sheath. Following this, I then passed the ureteroscope again up to the stone beside the safety wire. Using a 360  fiber, fragmented the stone into multiple small fragments, the largest of which were extracted into the bladder. The smaller stones were too small to grasp with the Nitinol basket and were left within the ureter, I was quite sure that these would pass following removal of the stent. All large fragments were removed. There was significant inflammation where the stone was lodged, and small urothelial injury. Because of this, I thought it wise to place a stent. Once the large fragments had been all removed from the ureter, I then, over the guidewire and using a cystoscope, passed a 26 cm x 6 French double-J stent. The string was left intact on the end. I tried rinsing the stones from the bladder briefly, but only a few small fragments came out. The rest were left for him to pass naturally. The bladder was drained, the scope removed and the string brought out through the urethra. This was taped to the penis. The patient was awakened and taken to PACU in stable condition. He will be discharged tonight with a prescription for Cipro. He has pain medicine at home.

## 2012-04-15 NOTE — Transfer of Care (Signed)
Immediate Anesthesia Transfer of Care Note  Patient: Dequincy Born  Procedure(s) Performed: Procedure(s): CYSTOSCOPY/RETROGRADE/URETEROSCOPY/STONE EXTRACTION WITH BASKET (Right) HOLMIUM LASER APPLICATION (Right)  Patient Location: PACU  Anesthesia Type:General  Level of Consciousness: awake, oriented and pateint uncooperative  Airway & Oxygen Therapy: Patient Spontanous Breathing and Patient connected to face mask oxygen  Post-op Assessment: Report given to PACU RN, Post -op Vital signs reviewed and stable and Patient moving all extremities  Post vital signs: Reviewed and stable  Complications: No apparent anesthesia complications

## 2012-04-15 NOTE — H&P (Signed)
Urology History and Physical Exam  CC: Kidney stone  HPI: 47 year old presents for ureteroscopy and lithotripsy with laser of a right ureteral stone. His history is as follows:   This 47 year old disabled male comes in today, emergently sent for recurrent flank pain and an 8 mm right distal ureteral stone. He became symptomatic with this first stone 3 days ago. He has had persistent pain, and re\re presented to the emergency room last night. CT scan revealed moderate right hydronephrosis and an 8-9 mm right distal ureteral stone. He has had no fever, chills, he has had nausea but no vomiting. No gross hematuria noted. He denies any prior history of kidney stones.   Because of his pain, he requested surgical management. The stone is distal, and he is a candidate for ureteroscopy and stone extraction with lithotripsy. We explained risks and complications of the procedure with him. He desires to proceed. PMH: Past Medical History  Diagnosis Date  . Thyroid disease   . 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  Allergies: No Known Allergies  Medications: Prescriptions prior to admission  Medication Sig Dispense Refill  . oxyCODONE-acetaminophen (PERCOCET/ROXICET) 5-325 MG per tablet Take 2 tablets by mouth every 4 (four) hours as needed for pain.  20 tablet  0  . tamsulosin (FLOMAX) 0.4 MG CAPS Take 1 capsule (0.4 mg total) by mouth daily.  14 capsule  0  . ondansetron (ZOFRAN) 4 MG tablet Take 1 tablet (4 mg total) by mouth every 6 (six) hours.  12 tablet  0  . polyethylene glycol (MIRALAX / GLYCOLAX) packet Take 17 g by mouth daily as needed (constipation).  14 each  0     Social History: History   Social History  . Marital Status: Single    Spouse Name: N/A    Number of Children: N/A  . Years of Education: N/A   Occupational History  . Not on file.   Social History Main Topics  . Smoking status: Current Every Day Smoker --  0.50 packs/day    Types: Cigarettes  . Smokeless tobacco: Not on file  . Alcohol Use: No  . Drug Use: Yes    Special: Marijuana  . Sexually Active: Not on file   Other Topics Concern  . Not on file   Social History Narrative  . No narrative on file    Family History: No family history on file.  Review of Systems: Genitourinary, constitutional, skin, eye, otolaryngeal, hematologic/lymphatic, cardiovascular, pulmonary, endocrine, musculoskeletal, gastrointestinal, neurological and psychiatric system(s) were reviewed and pertinent findings if present are noted.  Genitourinary: urinary frequency, nocturia and initiating urination requires straining.  Gastrointestinal: flank pain, abdominal pain and constipation.  Respiratory: cough.   Physical Exam: @VITALS2 @ General: No acute distress.  Awake. Head:  Normocephalic.  Atraumatic. ENT:  EOMI.  Mucous membranes moist Neck:  Supple.  No lymphadenopathy. CV:  S1 present. S2 present. Regular rate. Pulmonary: Equal effort bilaterally.  Clear to auscultation bilaterally. Abdomen: Soft. Significant right lower quadrant and CVA tenderness. No left-sided abdominal or CVA tenderness.. Skin:  Normal turgor.  No visible rash. Extremity: No gross deformity of bilateral upper extremities.  No gross deformity of    bilateral lower extremities. Neurologic: Alert. Appropriate mood.  Genital exam deferred to operating room  Studies:  Recent Labs     04/14/12  2015  HGB  11.5*  WBC  5.3  PLT  267  Recent Labs     04/14/12  2015  NA  135  K  4.8  CL  101  CO2  26  BUN  13  CREATININE  1.90*  CALCIUM  8.3*  GFRNONAA  40*  GFRAA  47*     No results found for this basename: PT, INR, APTT,  in the last 72 hours   No components found with this basename: ABG,     Assessment:  Symptomatic right distal ureteral stone, and 8 and 9 mm in size  Plan: Anesthetic cystoscopy, ureteroscopy, holmium laser ablation and extraction of  stone. We may leave a stent.

## 2012-04-15 NOTE — Preoperative (Signed)
Beta Blockers   Reason not to administer Beta Blockers:Not Applicable 

## 2012-04-18 ENCOUNTER — Encounter (HOSPITAL_COMMUNITY): Payer: Self-pay | Admitting: Urology

## 2012-10-12 ENCOUNTER — Encounter (HOSPITAL_COMMUNITY): Payer: Self-pay | Admitting: Emergency Medicine

## 2012-10-12 ENCOUNTER — Emergency Department (HOSPITAL_COMMUNITY)
Admission: EM | Admit: 2012-10-12 | Discharge: 2012-10-12 | Disposition: A | Discharge status: Home / Self Care | Payer: No Typology Code available for payment source | Attending: Emergency Medicine | Admitting: Emergency Medicine

## 2012-10-12 ENCOUNTER — Emergency Department (HOSPITAL_COMMUNITY): Payer: No Typology Code available for payment source

## 2012-10-12 DIAGNOSIS — Y92410 Unspecified street and highway as the place of occurrence of the external cause: Secondary | ICD-10-CM | POA: Insufficient documentation

## 2012-10-12 DIAGNOSIS — Z791 Long term (current) use of non-steroidal anti-inflammatories (NSAID): Secondary | ICD-10-CM | POA: Diagnosis not present

## 2012-10-12 DIAGNOSIS — Y9389 Activity, other specified: Secondary | ICD-10-CM | POA: Insufficient documentation

## 2012-10-12 DIAGNOSIS — M545 Low back pain: Secondary | ICD-10-CM

## 2012-10-12 DIAGNOSIS — F17200 Nicotine dependence, unspecified, uncomplicated: Secondary | ICD-10-CM | POA: Diagnosis not present

## 2012-10-12 DIAGNOSIS — IMO0002 Reserved for concepts with insufficient information to code with codable children: Secondary | ICD-10-CM | POA: Diagnosis present

## 2012-10-12 DIAGNOSIS — Z8673 Personal history of transient ischemic attack (TIA), and cerebral infarction without residual deficits: Secondary | ICD-10-CM | POA: Insufficient documentation

## 2012-10-12 DIAGNOSIS — Z79899 Other long term (current) drug therapy: Secondary | ICD-10-CM | POA: Insufficient documentation

## 2012-10-12 HISTORY — DX: Dorsalgia, unspecified: M54.9

## 2012-10-12 MED ORDER — IBUPROFEN 400 MG PO TABS
400.0000 mg | ORAL_TABLET | Freq: Once | ORAL | Status: AC
  Administered 2012-10-12: 400 mg via ORAL
  Filled 2012-10-12: qty 1

## 2012-10-12 MED ORDER — METHOCARBAMOL 500 MG PO TABS: 500.0000 mg | ORAL_TABLET | Freq: Two times a day (BID) | ORAL | Status: DC

## 2012-10-12 MED ORDER — IBUPROFEN 800 MG PO TABS: 800.0000 mg | ORAL_TABLET | Freq: Three times a day (TID) | ORAL | Status: DC
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
## 2012-10-12 NOTE — ED Notes (Signed)
ED PA at BS 

## 2012-10-12 NOTE — ED Notes (Signed)
Pt alert, NAD, calm, interactive, skin W&D, resps e/u, speaking in clear complete sentences, back to room in w/c, self transferred from w/c to stretcher, moving slowly, c/o low back pain, MAEx4, CMS intact.

## 2012-10-14 NOTE — ED Provider Notes (Signed)
Medical screening examination/treatment/procedure(s) were performed by non-physician practitioner and as supervising physician I was immediately available for consultation/collaboration.   Mckinna Demars B. Bernette Mayers, MD 10/14/12 2031

## 2012-10-24 ENCOUNTER — Encounter (HOSPITAL_COMMUNITY): Payer: Self-pay | Admitting: Emergency Medicine

## 2012-10-24 ENCOUNTER — Observation Stay (HOSPITAL_COMMUNITY)
Admission: EM | Admit: 2012-10-24 | Discharge: 2012-10-27 | Disposition: A | Discharge status: Home / Self Care | Payer: Medicaid Other | Attending: Internal Medicine | Admitting: Internal Medicine

## 2012-10-24 DIAGNOSIS — F419 Anxiety disorder, unspecified: Secondary | ICD-10-CM | POA: Diagnosis present

## 2012-10-24 DIAGNOSIS — K644 Residual hemorrhoidal skin tags: Secondary | ICD-10-CM

## 2012-10-24 DIAGNOSIS — Z8673 Personal history of transient ischemic attack (TIA), and cerebral infarction without residual deficits: Secondary | ICD-10-CM | POA: Insufficient documentation

## 2012-10-24 DIAGNOSIS — W19XXXA Unspecified fall, initial encounter: Secondary | ICD-10-CM | POA: Insufficient documentation

## 2012-10-24 DIAGNOSIS — E039 Hypothyroidism, unspecified: Secondary | ICD-10-CM

## 2012-10-24 DIAGNOSIS — S0100XA Unspecified open wound of scalp, initial encounter: Secondary | ICD-10-CM | POA: Insufficient documentation

## 2012-10-24 DIAGNOSIS — M545 Low back pain, unspecified: Secondary | ICD-10-CM | POA: Insufficient documentation

## 2012-10-24 DIAGNOSIS — F411 Generalized anxiety disorder: Secondary | ICD-10-CM | POA: Insufficient documentation

## 2012-10-24 DIAGNOSIS — G8929 Other chronic pain: Secondary | ICD-10-CM | POA: Insufficient documentation

## 2012-10-24 DIAGNOSIS — F17200 Nicotine dependence, unspecified, uncomplicated: Secondary | ICD-10-CM | POA: Insufficient documentation

## 2012-10-24 DIAGNOSIS — I498 Other specified cardiac arrhythmias: Secondary | ICD-10-CM | POA: Insufficient documentation

## 2012-10-24 DIAGNOSIS — D709 Neutropenia, unspecified: Secondary | ICD-10-CM | POA: Insufficient documentation

## 2012-10-24 DIAGNOSIS — E876 Hypokalemia: Secondary | ICD-10-CM | POA: Insufficient documentation

## 2012-10-24 DIAGNOSIS — F1210 Cannabis abuse, uncomplicated: Secondary | ICD-10-CM | POA: Insufficient documentation

## 2012-10-24 DIAGNOSIS — R55 Syncope and collapse: Principal | ICD-10-CM | POA: Diagnosis present

## 2012-10-24 DIAGNOSIS — R001 Bradycardia, unspecified: Secondary | ICD-10-CM

## 2012-10-24 DIAGNOSIS — I959 Hypotension, unspecified: Secondary | ICD-10-CM | POA: Insufficient documentation

## 2012-10-24 DIAGNOSIS — Y92009 Unspecified place in unspecified non-institutional (private) residence as the place of occurrence of the external cause: Secondary | ICD-10-CM | POA: Insufficient documentation

## 2012-10-24 DIAGNOSIS — Z79899 Other long term (current) drug therapy: Secondary | ICD-10-CM | POA: Insufficient documentation

## 2012-10-24 DIAGNOSIS — G510 Bell's palsy: Secondary | ICD-10-CM

## 2012-10-24 DIAGNOSIS — D638 Anemia in other chronic diseases classified elsewhere: Secondary | ICD-10-CM | POA: Insufficient documentation

## 2012-10-24 DIAGNOSIS — N181 Chronic kidney disease, stage 1: Secondary | ICD-10-CM | POA: Insufficient documentation

## 2012-10-24 DIAGNOSIS — R634 Abnormal weight loss: Secondary | ICD-10-CM

## 2012-10-24 MED ORDER — LORAZEPAM 2 MG/ML IJ SOLN
0.5000 mg | Freq: Once | INTRAMUSCULAR | Status: AC
  Administered 2012-10-25: 0.5 mg via INTRAVENOUS
  Filled 2012-10-24: qty 1

## 2012-10-24 MED ORDER — SODIUM CHLORIDE 0.9 % IV BOLUS (SEPSIS)
1000.0000 mL | Freq: Once | INTRAVENOUS | Status: AC
  Administered 2012-10-25: 1000 mL via INTRAVENOUS

## 2012-10-24 NOTE — ED Notes (Signed)
Patient found by EMS on floor laying on left side. Primary complaint at that time was feeling hot. Patient had BP of 118/64 with HR of 70 while laying on floor. EMS assisted to stand and ambulated to stretcher. Afterwards patient found to be bradycardic at 43, and hypotensive to 77/46. BP and HR improved en route.

## 2012-10-25 ENCOUNTER — Emergency Department (HOSPITAL_COMMUNITY): Payer: Medicaid Other

## 2012-10-25 DIAGNOSIS — F419 Anxiety disorder, unspecified: Secondary | ICD-10-CM | POA: Diagnosis present

## 2012-10-25 DIAGNOSIS — R001 Bradycardia, unspecified: Secondary | ICD-10-CM | POA: Diagnosis present

## 2012-10-25 DIAGNOSIS — F411 Generalized anxiety disorder: Secondary | ICD-10-CM

## 2012-10-25 DIAGNOSIS — R55 Syncope and collapse: Secondary | ICD-10-CM

## 2012-10-25 DIAGNOSIS — I498 Other specified cardiac arrhythmias: Secondary | ICD-10-CM

## 2012-10-25 DIAGNOSIS — I517 Cardiomegaly: Secondary | ICD-10-CM

## 2012-10-25 LAB — COMPREHENSIVE METABOLIC PANEL
ALT: 41 U/L (ref 0–53)
AST: 70 U/L — ABNORMAL HIGH (ref 0–37)
Albumin: 4.1 g/dL (ref 3.5–5.2)
CO2: 23 mEq/L (ref 19–32)
Chloride: 104 mEq/L (ref 96–112)
GFR calc non Af Amer: 56 mL/min — ABNORMAL LOW (ref >90)
Potassium: 3.3 mEq/L — ABNORMAL LOW (ref 3.5–5.1)
Sodium: 140 mEq/L (ref 135–145)
Total Bilirubin: 0.6 mg/dL (ref 0.3–1.2)

## 2012-10-25 LAB — URINALYSIS, ROUTINE W REFLEX MICROSCOPIC
Glucose, UA: NEGATIVE mg/dL
Hgb urine dipstick: NEGATIVE
Ketones, ur: NEGATIVE mg/dL
Leukocytes, UA: NEGATIVE
Protein, ur: NEGATIVE mg/dL
pH: 8.5 — ABNORMAL HIGH (ref 5.0–8.0)

## 2012-10-25 LAB — CBC WITH DIFFERENTIAL/PLATELET
Basophils Absolute: 0.1 10*3/uL (ref 0.0–0.1)
Basophils Relative: 2 % — ABNORMAL HIGH (ref 0–1)
HCT: 35.3 % — ABNORMAL LOW (ref 39.0–52.0)
Lymphocytes Relative: 60 % — ABNORMAL HIGH (ref 12–46)
MCHC: 34.3 g/dL (ref 30.0–36.0)
Monocytes Absolute: 0.2 10*3/uL (ref 0.1–1.0)
Neutro Abs: 1.1 10*3/uL — ABNORMAL LOW (ref 1.7–7.7)
Neutrophils Relative %: 31 % — ABNORMAL LOW (ref 43–77)
Platelets: 234 10*3/uL (ref 150–400)
RDW: 14.9 % (ref 11.5–15.5)
WBC: 3.6 10*3/uL — ABNORMAL LOW (ref 4.0–10.5)

## 2012-10-25 LAB — RAPID URINE DRUG SCREEN, HOSP PERFORMED
Amphetamines: NOT DETECTED
Benzodiazepines: NOT DETECTED
Opiates: NOT DETECTED
Tetrahydrocannabinol: POSITIVE — AB

## 2012-10-25 LAB — D-DIMER, QUANTITATIVE: D-Dimer, Quant: 0.35 ug/mL-FEU (ref 0.00–0.48)

## 2012-10-25 LAB — ETHANOL: Alcohol, Ethyl (B): <11 mg/dL (ref 0–11)

## 2012-10-25 MED ORDER — SODIUM CHLORIDE 0.9 % IJ SOLN
3.0000 mL | Freq: Two times a day (BID) | INTRAMUSCULAR | Status: DC
  Administered 2012-10-25 – 2012-10-26 (×2): 3 mL via INTRAVENOUS

## 2012-10-25 MED ORDER — IBUPROFEN 600 MG PO TABS
600.0000 mg | ORAL_TABLET | Freq: Once | ORAL | Status: DC
  Filled 2012-10-25: qty 1

## 2012-10-25 MED ORDER — ACETAMINOPHEN 325 MG PO TABS
650.0000 mg | ORAL_TABLET | ORAL | Status: DC | PRN
  Administered 2012-10-25 – 2012-10-27 (×2): 650 mg via ORAL
  Filled 2012-10-25 (×2): qty 2

## 2012-10-25 MED ORDER — SODIUM CHLORIDE 0.9 % IV SOLN
INTRAVENOUS | Status: AC
  Administered 2012-10-25: 06:00:00 via INTRAVENOUS

## 2012-10-25 MED ORDER — SODIUM CHLORIDE 0.9 % IV BOLUS (SEPSIS)
1000.0000 mL | Freq: Once | INTRAVENOUS | Status: AC
  Administered 2012-10-25: 1000 mL via INTRAVENOUS

## 2012-10-25 MED ORDER — TRAMADOL HCL 50 MG PO TABS
50.0000 mg | ORAL_TABLET | Freq: Four times a day (QID) | ORAL | Status: AC | PRN
  Administered 2012-10-25 – 2012-10-26 (×2): 50 mg via ORAL
  Filled 2012-10-25 (×2): qty 1

## 2012-10-25 MED ORDER — POTASSIUM CHLORIDE CRYS ER 20 MEQ PO TBCR
40.0000 meq | EXTENDED_RELEASE_TABLET | Freq: Two times a day (BID) | ORAL | Status: AC
  Administered 2012-10-25 (×2): 40 meq via ORAL
  Filled 2012-10-25 (×2): qty 2

## 2012-10-25 MED ORDER — TETANUS-DIPHTH-ACELL PERTUSSIS 5-2.5-18.5 LF-MCG/0.5 IM SUSP
0.5000 mL | Freq: Once | INTRAMUSCULAR | Status: AC
  Administered 2012-10-25: 0.5 mL via INTRAMUSCULAR
  Filled 2012-10-25: qty 0.5

## 2012-10-25 NOTE — Progress Notes (Signed)
INITIAL NUTRITION ASSESSMENT  DOCUMENTATION CODES Per approved criteria  -Obesity Unspecified   INTERVENTION: - Will provide snacks daily per patient preference - Menu options explained to patient, he was encouraged to choose foods per preference  NUTRITION DIAGNOSIS: Inadequate oral intake related to decreased appetite/food preferences as evidenced by 0% intake.   Goal: Patient will meet >/=90% of estimated nutrition needs  Monitor:  PO intake, weight, labs  Reason for Assessment: Malnutrition screening tool  47 y.o. male  Admitting Dx: Syncope  ASSESSMENT: Patient admitted with syncope s/p fall. He reports that he has had a poor appetite for the last few months, and reports a 12 pound weight loss over that time. Intake is poor currently, however, this is due in part to food preferences. He is requesting a snack daily.   Height: Ht Readings from Last 1 Encounters:  10/25/12 6\' 1"  (1.854 m)    Weight: Wt Readings from Last 1 Encounters:  10/25/12 198 lb 12.8 oz (90.175 kg)    Ideal Body Weight: 172 pounds  % Ideal Body Weight: 115%  Wt Readings from Last 10 Encounters:  10/25/12 198 lb 12.8 oz (90.175 kg)  10/12/12 210 lb (95.255 kg)    Usual Body Weight: 210 pounds  % Usual Body Weight: 94%  BMI:  Body mass index is 26.23 kg/(m^2). Patient is overweight.  Estimated Nutritional Needs: Kcal: 2000-2200 kcal Protein: 105-120 g Fluid: 2.7 L/day  Skin: laceration, head  Diet Order: Cardiac  EDUCATION NEEDS: -No education needs identified at this time   Intake/Output Summary (Last 24 hours) at 10/25/12 1357 Last data filed at 10/25/12 0635  Gross per 24 hour  Intake      0 ml  Output    275 ml  Net   -275 ml    Last BM: PTA   Labs:   Recent Labs Lab 10/25/12 0003  NA 140  K 3.3*  CL 104  CO2 23  BUN 10  CREATININE 1.44*  CALCIUM 9.5  GLUCOSE 87    CBG (last 3)  No results found for this basename: GLUCAP,  in the last 72  hours  Scheduled Meds: . sodium chloride   Intravenous STAT  . potassium chloride  40 mEq Oral BID  . sodium chloride  3 mL Intravenous Q12H    Continuous Infusions:   Past Medical History  Diagnosis Date  . Thyroid disease   . 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 Back pain     Past Surgical History  Procedure Laterality Date  . Cystoscopy/retrograde/ureteroscopy/stone extraction with basket Right 04/15/2012    Procedure: CYSTOSCOPY/RETROGRADE/URETEROSCOPY/STONE EXTRACTION WITH BASKET;  Surgeon: Marcine Matar, MD;  Location: [facility];  Service: Urology;  Laterality: Right;  . Holmium laser application Right 04/15/2012    Procedure: HOLMIUM LASER APPLICATION;  Surgeon: Marcine Matar, MD;  Location: [facility];  Service: Urology;  Laterality: Right;    Linnell Fulling, RD, LDN Pager #: [phone] After-Hours Pager #: [phone]

## 2012-10-25 NOTE — Progress Notes (Signed)
  Echocardiogram 2D Echocardiogram has been performed.  Arvil Chaco 10/25/2012, 3:26 PM
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
## 2012-10-25 NOTE — ED Notes (Signed)
IV flushed now running Site without redness or swelling

## 2012-10-26 DIAGNOSIS — I495 Sick sinus syndrome: Secondary | ICD-10-CM

## 2012-10-26 DIAGNOSIS — R55 Syncope and collapse: Secondary | ICD-10-CM

## 2012-10-26 DIAGNOSIS — E039 Hypothyroidism, unspecified: Secondary | ICD-10-CM

## 2012-10-26 LAB — TROPONIN I: Troponin I: <0.3 ng/mL (ref <0.30)

## 2012-10-26 LAB — CBC
MCH: 30.5 pg (ref 26.0–34.0)
Platelets: 209 10*3/uL (ref 150–400)
RBC: 3.67 MIL/uL — ABNORMAL LOW (ref 4.22–5.81)
WBC: 3.6 10*3/uL — ABNORMAL LOW (ref 4.0–10.5)

## 2012-10-26 LAB — BASIC METABOLIC PANEL
Calcium: 8.3 mg/dL — ABNORMAL LOW (ref 8.4–10.5)
Chloride: 105 mEq/L (ref 96–112)
Creatinine, Ser: 1.34 mg/dL (ref 0.50–1.35)
GFR calc Af Amer: 70 mL/min — ABNORMAL LOW (ref >90)
Potassium: 4.1 mEq/L (ref 3.5–5.1)

## 2012-10-26 LAB — HIV ANTIBODY (ROUTINE TESTING W REFLEX): HIV: NONREACTIVE

## 2012-10-26 MED ORDER — LEVOTHYROXINE SODIUM 25 MCG PO TABS
25.0000 ug | ORAL_TABLET | Freq: Every day | ORAL | Status: DC
  Administered 2012-10-26: 25 ug via ORAL
  Filled 2012-10-26 (×2): qty 1

## 2012-10-26 MED ORDER — HEPARIN SODIUM (PORCINE) 5000 UNIT/ML IJ SOLN
5000.0000 [IU] | Freq: Three times a day (TID) | INTRAMUSCULAR | Status: DC
  Filled 2012-10-26 (×6): qty 1

## 2012-10-26 MED ORDER — LEVOTHYROXINE SODIUM 25 MCG PO TABS: 25.0000 ug | ORAL_TABLET | Freq: Every day | ORAL | Status: DC

## 2012-10-26 MED ORDER — SODIUM CHLORIDE 0.9 % IV SOLN
INTRAVENOUS | Status: DC
  Administered 2012-10-26 (×2): via INTRAVENOUS
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
## 2012-10-26 NOTE — Progress Notes (Signed)
VASCULAR LAB PRELIMINARY  PRELIMINARY  PRELIMINARY  PRELIMINARY  Carotid duplex  completed.    Preliminary report:  Bilateral:  1-39% ICA stenosis.  Vertebral artery flow is antegrade.      George Floyd, RVT 10/26/2012, 12:24 PM

## 2012-10-26 NOTE — Progress Notes (Signed)
Pt refusing IV fluids at this time. Pt states that he will let us "put them back in  Him" after he takes a shower this afternoon. [employer], Rn
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
## 2012-10-26 NOTE — Progress Notes (Signed)
Pt IV infiltrated. IV removed and heat applied. Pt refusing restart at this time. Pt states that he may allow a restart in an hour. WIll continue to monitor. Levonne Spiller, RN

## 2012-10-26 NOTE — Progress Notes (Addendum)
Pt continuing to refuse IV fluids at this time. Pt angry and stated "the doctors secretary came by and I'm not starting anything until the doctor comes by. It will probably be tomorrow." Pt educated on how consulting doctors and physician assistants round on patients. Pt still refusing IV fluids. Pt also verbalizing the need to smoke. Nurse offered to obtain order for nicotene patch. Pt refused stating "that wont help anything that's not what I need." Will continue to monitor. Levonne Spiller, RN

## 2012-10-27 DIAGNOSIS — E039 Hypothyroidism, unspecified: Secondary | ICD-10-CM

## 2012-10-27 LAB — CBC
Hemoglobin: 11 g/dL — ABNORMAL LOW (ref 13.0–17.0)
MCHC: 34.8 g/dL (ref 30.0–36.0)
Platelets: 197 10*3/uL (ref 150–400)
RBC: 3.57 MIL/uL — ABNORMAL LOW (ref 4.22–5.81)
RDW: 14.9 % (ref 11.5–15.5)

## 2012-10-27 MED ORDER — OXYCODONE-ACETAMINOPHEN 5-325 MG PO TABS: 1.0000 | ORAL_TABLET | Freq: Three times a day (TID) | ORAL | Status: DC | PRN

## 2012-10-27 MED ORDER — LEVOTHYROXINE SODIUM 50 MCG PO TABS
50.0000 ug | ORAL_TABLET | Freq: Every day | ORAL | Status: DC
  Filled 2012-10-27: qty 1

## 2012-10-27 MED ORDER — LEVOTHYROXINE SODIUM 50 MCG PO TABS: 50.0000 ug | ORAL_TABLET | Freq: Every day | ORAL | Status: DC
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
## 2012-10-27 NOTE — Progress Notes (Addendum)
Pt. Refusing AM Orthostatic Vital signs.  Pt. Educated on reason for orthostatic VS given syncope however pt. States "I am not getting them right now."

## 2012-11-01 ENCOUNTER — Encounter (HOSPITAL_COMMUNITY): Payer: Self-pay | Admitting: Emergency Medicine

## 2012-11-01 ENCOUNTER — Emergency Department (HOSPITAL_COMMUNITY)
Admission: EM | Admit: 2012-11-01 | Discharge: 2012-11-01 | Disposition: A | Discharge status: Home / Self Care | Payer: Medicaid Other | Attending: Emergency Medicine | Admitting: Emergency Medicine

## 2012-11-01 DIAGNOSIS — Z79899 Other long term (current) drug therapy: Secondary | ICD-10-CM | POA: Insufficient documentation

## 2012-11-01 DIAGNOSIS — M549 Dorsalgia, unspecified: Secondary | ICD-10-CM | POA: Insufficient documentation

## 2012-11-01 DIAGNOSIS — Z4802 Encounter for removal of sutures: Secondary | ICD-10-CM | POA: Insufficient documentation

## 2012-11-01 DIAGNOSIS — Z760 Encounter for issue of repeat prescription: Secondary | ICD-10-CM | POA: Insufficient documentation

## 2012-11-01 DIAGNOSIS — E039 Hypothyroidism, unspecified: Secondary | ICD-10-CM | POA: Insufficient documentation

## 2012-11-01 DIAGNOSIS — F17200 Nicotine dependence, unspecified, uncomplicated: Secondary | ICD-10-CM | POA: Insufficient documentation

## 2012-11-01 DIAGNOSIS — Z8673 Personal history of transient ischemic attack (TIA), and cerebral infarction without residual deficits: Secondary | ICD-10-CM | POA: Insufficient documentation

## 2012-11-01 MED ORDER — LEVOTHYROXINE SODIUM 50 MCG PO TABS: 50.0000 ug | ORAL_TABLET | Freq: Every day | ORAL | Status: DC

## 2012-11-01 NOTE — ED Notes (Signed)
Pt states he also wants new prescriptions for the prescriptions he was given last week
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
## 2012-11-01 NOTE — ED Provider Notes (Signed)
CSN: [id number]     Arrival date & time 11/01/12  1737 History  This chart was scribed for non-physician practitioner Dierdre Forth, PA-C, working with Enid Skeens, MD by Dorothey Baseman, ED Scribe. This patient was seen in room TR04C/TR04C and the patient's care was started at 7:41 PM.    Chief Complaint  Patient presents with  . Suture / Staple Removal   Patient is a 47 y.o. male presenting with suture removal. The history is provided by the patient and medical records. No language interpreter was used.  Suture / Staple Removal This is a new problem. The current episode started more than 1 week ago. The problem occurs constantly. The problem has been gradually improving. Nothing aggravates the symptoms.  Suture / Staple Removal This is a new problem. The current episode started more than 1 week ago. The problem occurs constantly. The problem has been gradually improving. Pertinent negatives include no chills, fever, nausea, numbness, vomiting or weakness. Nothing aggravates the symptoms.   HPI Comments: George Floyd is a 47 y.o. male who presents to the Emergency Department requesting removal of 1 staple from the back of the head that he received last week. Patient was seen here on 10/25/2012 for a syncopal episode when the patient fell and struck the back of his head. Patient was admitted to the hospital and discharged on 10/27/2012 with synthroid. Patient reports that he is unsure how long he was supposed to wait to have it removed. He denies fever, chills, nausea, and emesis. Patient reports a history of stroke and back pain.   Patient is also requesting a refill of his prescription for synthroid and states that he will not be able to fill it until 11/05/2012.   Past Medical History  Diagnosis Date  . Thyroid disease   . 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 Back pain    Past Surgical History  Procedure Laterality Date  . Cystoscopy/retrograde/ureteroscopy/stone  extraction with basket Right 04/15/2012    Procedure: CYSTOSCOPY/RETROGRADE/URETEROSCOPY/STONE EXTRACTION WITH BASKET;  Surgeon: Marcine Matar, MD;  Location: [facility];  Service: Urology;  Laterality: Right;  . Holmium laser application Right 04/15/2012    Procedure: HOLMIUM LASER APPLICATION;  Surgeon: Marcine Matar, MD;  Location: [facility];  Service: Urology;  Laterality: Right;   History reviewed. No pertinent family history. History  Substance Use Topics  . Smoking status: Current Every Day Smoker -- 0.50 packs/day    Types: Cigarettes  . Smokeless tobacco: Not on file  . Alcohol Use: No    Review of Systems  Constitutional: Negative for fever and chills.  Gastrointestinal: Negative for nausea and vomiting.  Skin: Positive for wound.  Allergic/Immunologic: Negative for immunocompromised state.  Neurological: Negative for weakness and numbness.  Hematological: Does not bruise/bleed easily.  Psychiatric/Behavioral: The patient is not nervous/anxious.     Allergies  Review of patient's allergies indicates no known allergies.  Home Medications   Current Outpatient Rx  Name  Route  Sig  Dispense  Refill  . albuterol (PROVENTIL HFA;VENTOLIN HFA) 108 (90 BASE) MCG/ACT inhaler   Inhalation   Inhale 2 puffs into the lungs every 4 (four) hours as needed for wheezing.         Marland Kitchen ibuprofen (ADVIL,MOTRIN) 800 MG tablet   Oral   Take 800 mg by mouth every 6 (six) hours as needed for pain.         Marland Kitchen levothyroxine (SYNTHROID, LEVOTHROID) 50 MCG tablet  Oral   Take 1 tablet (50 mcg total) by mouth daily before breakfast.   30 tablet   0    Triage Vitals: BP 117/84  Pulse 72  Temp(Src) 98.8 F (37.1 C) (Oral)  Resp 16  Wt 181 lb 7 oz (82.3 kg)  BMI 23.94 kg/m2  SpO2 97%  Physical Exam  Nursing note and vitals reviewed. Constitutional: He is oriented to person, place, and time. He appears well-developed and well-nourished. No distress.  Awake, alert, nontoxic  appearance  HENT:  Head: Normocephalic and atraumatic.  Mouth/Throat: No oropharyngeal exudate.  Eyes: Conjunctivae are normal. No scleral icterus.  Neck: Normal range of motion. Neck supple.  Cardiovascular: Intact distal pulses.   No murmur heard. Capillary refill < 3 sec  Pulmonary/Chest: Effort normal and breath sounds normal. No respiratory distress.  Musculoskeletal: Normal range of motion. He exhibits no edema.  Neurological: He is alert and oriented to person, place, and time.  Speech is clear and goal oriented Moves extremities without ataxia  Skin: Skin is warm and dry. He is not diaphoretic. No erythema.  3 cm laceration to the back of the head that is well-healed.  Psychiatric: He has a normal mood and affect.    ED Course  Procedures (including critical care time)  DIAGNOSTIC STUDIES: Oxygen Saturation is 97% on room air, normal by my interpretation.    COORDINATION OF CARE: 7:43 PM- Will remove the staple. Will refill his prescriptions. Discussed treatment plan with patient at bedside and patient verbalized agreement.   SUTURE REMOVAL Performed by: Dierdre Forth, PA-C, Authorized by: Enid Skeens, MD Consent: Verbal consent obtained. Consent given by: patient Required items: required blood products, implants, devices, and special equipment available  Time out: Immediately prior to procedure a "time out" was called to verify the correct patient, procedure, equipment, support staff and site/side marked as required. Location: back of the scalp Wound Appearance: well-healed and clean  Staples Removed: 1 Patient tolerance: Patient tolerated the procedure well with no immediate complications.     Labs Review Labs Reviewed - No data to display Imaging Review No results found.  EKG Interpretation   None       MDM   1. Encounter for staple removal   2. Medication refill   3. Hypothyroidism    Staple removal   Pt to ER for staple/suture  removal and wound check as above. Procedure tolerated well. Vitals normal, no signs of infection. Scar minimization & return precautions given at dc. Pt also given refill on synthroid as requested.    It has been determined that no acute conditions requiring further emergency intervention are present at this time. The patient/guardian have been advised of the diagnosis and plan. We have discussed signs and symptoms that warrant return to the ED, such as changes or worsening in symptoms.   Vital signs are stable at discharge.   BP 117/84  Pulse 72  Temp(Src) 98.8 F (37.1 C) (Oral)  Resp 16  Wt 181 lb 7 oz (82.3 kg)  BMI 23.94 kg/m2  SpO2 97%  Patient/guardian has voiced understanding and agreed to follow-up with the PCP or specialist.   I personally performed the services described in this documentation, which was scribed in my presence. The recorded information has been reviewed and is accurate.   Dahlia Client Nilan Iddings, PA-C 11/01/12 1950

## 2012-11-02 NOTE — ED Provider Notes (Signed)
Medical screening examination/treatment/procedure(s) were performed by non-physician practitioner and as supervising physician I was immediately available for consultation/collaboration.  EKG Interpretation   None         Shaneese Tait M Obie Kallenbach, MD 11/02/12 0027 

## 2012-11-11 ENCOUNTER — Emergency Department (HOSPITAL_COMMUNITY)
Admission: EM | Admit: 2012-11-11 | Discharge: 2012-11-11 | Disposition: A | Discharge status: Home / Self Care | Payer: Medicaid Other | Attending: Emergency Medicine | Admitting: Emergency Medicine

## 2012-11-11 ENCOUNTER — Encounter (HOSPITAL_COMMUNITY): Payer: Self-pay | Admitting: Emergency Medicine

## 2012-11-11 DIAGNOSIS — F17200 Nicotine dependence, unspecified, uncomplicated: Secondary | ICD-10-CM | POA: Insufficient documentation

## 2012-11-11 DIAGNOSIS — T3695XA Adverse effect of unspecified systemic antibiotic, initial encounter: Secondary | ICD-10-CM | POA: Insufficient documentation

## 2012-11-11 DIAGNOSIS — E079 Disorder of thyroid, unspecified: Secondary | ICD-10-CM | POA: Insufficient documentation

## 2012-11-11 DIAGNOSIS — T7840XA Allergy, unspecified, initial encounter: Secondary | ICD-10-CM

## 2012-11-11 DIAGNOSIS — Z8673 Personal history of transient ischemic attack (TIA), and cerebral infarction without residual deficits: Secondary | ICD-10-CM | POA: Insufficient documentation

## 2012-11-11 DIAGNOSIS — R220 Localized swelling, mass and lump, head: Secondary | ICD-10-CM | POA: Insufficient documentation

## 2012-11-11 DIAGNOSIS — Z79899 Other long term (current) drug therapy: Secondary | ICD-10-CM | POA: Insufficient documentation

## 2012-11-11 MED ORDER — DIPHENHYDRAMINE HCL 50 MG/ML IJ SOLN
25.0000 mg | Freq: Once | INTRAMUSCULAR | Status: AC
  Administered 2012-11-11: 25 mg via INTRAMUSCULAR
  Filled 2012-11-11: qty 1

## 2012-11-11 MED ORDER — SULFAMETHOXAZOLE-TRIMETHOPRIM 800-160 MG PO TABS: 1.0000 | ORAL_TABLET | Freq: Two times a day (BID) | ORAL | Status: DC

## 2012-11-11 MED ORDER — FAMOTIDINE 20 MG PO TABS
40.0000 mg | ORAL_TABLET | Freq: Once | ORAL | Status: AC
  Administered 2012-11-11: 40 mg via ORAL
  Filled 2012-11-11: qty 2

## 2012-11-11 MED ORDER — METHYLPREDNISOLONE SODIUM SUCC 125 MG IJ SOLR
125.0000 mg | Freq: Once | INTRAMUSCULAR | Status: AC
  Administered 2012-11-11: 125 mg via INTRAMUSCULAR
  Filled 2012-11-11: qty 2

## 2012-11-11 NOTE — ED Notes (Signed)
The pt has a rash over his body since this am he saw his doctor yesterday and was given med for a rash under his rt arm.  He thinks that caused his rash.  He was also seen here last month and given med that he is still taking.  At present no hives seen

## 2012-11-11 NOTE — ED Notes (Signed)
PT noted to be taking two different strengths of Synthroid. Pharmacy notified to obtain clarification for patient

## 2012-11-11 NOTE — ED Notes (Signed)
Pt comfortable with d/c and f/u instructions. Pt instructed by EDPA to continue taking Synthroid as prescribed and to f/u with PCP as soon as possible to discuss medications.

## 2012-11-11 NOTE — ED Provider Notes (Signed)
CSN: [id number]     Arrival date & time 11/11/12  1546 History  This chart was scribed for non-physician practitioner, Emilia Beck, PA-C,working with Donnetta Hutching, MD, by Karle Plumber, ED Scribe.  This patient was seen in room TR08C/TR08C and the patient's care was started at 6:10 PM.  Chief Complaint  Patient presents with  . Rash   The history is provided by the patient. No language interpreter was used.   HPI Comments:  George Floyd is a 47 y.o. male who presents to the Emergency Department complaining of a severe itchy rash onset several hours ago. He reports associated swelling of his facial area around his eyes.  Pt states he recently started Doxycycline yesterday for folliculitis and started experiencing the sudden onset of symptoms this morning. He denies SOB, tongue or throat swelling.   Past Medical History  Diagnosis Date  . Thyroid disease   . 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 Back pain    Past Surgical History  Procedure Laterality Date  . Cystoscopy/retrograde/ureteroscopy/stone extraction with basket Right 04/15/2012    Procedure: CYSTOSCOPY/RETROGRADE/URETEROSCOPY/STONE EXTRACTION WITH BASKET;  Surgeon: Marcine Matar, MD;  Location: [facility];  Service: Urology;  Laterality: Right;  . Holmium laser application Right 04/15/2012    Procedure: HOLMIUM LASER APPLICATION;  Surgeon: Marcine Matar, MD;  Location: [facility];  Service: Urology;  Laterality: Right;   No family history on file. History  Substance Use Topics  . Smoking status: Current Every Day Smoker -- 0.50 packs/day    Types: Cigarettes  . Smokeless tobacco: Not on file  . Alcohol Use: No    Review of Systems  Skin: Positive for rash.  All other systems reviewed and are negative.    Allergies  Review of patient's allergies indicates no known allergies.  Home Medications   Current Outpatient Rx  Name  Route  Sig  Dispense  Refill  . albuterol (PROVENTIL HFA;VENTOLIN HFA)  108 (90 BASE) MCG/ACT inhaler   Inhalation   Inhale 2 puffs into the lungs every 4 (four) hours as needed for wheezing.         Marland Kitchen ibuprofen (ADVIL,MOTRIN) 800 MG tablet   Oral   Take 800 mg by mouth every 6 (six) hours as needed for pain.         Marland Kitchen levothyroxine (SYNTHROID, LEVOTHROID) 50 MCG tablet   Oral   Take 1 tablet (50 mcg total) by mouth daily before breakfast.   30 tablet   0    Triage Vitals: BP 108/72  Pulse 54  Temp(Src) 98.2 F (36.8 C)  Resp 20  Ht 6\' 1"  (1.854 m)  Wt 181 lb (82.101 kg)  BMI 23.89 kg/m2  SpO2 100% Physical Exam  Nursing note and vitals reviewed. Constitutional: He is oriented to person, place, and time. He appears well-developed and well-nourished. No distress.  HENT:  Head: Normocephalic and atraumatic.  Eyes: Conjunctivae are normal. No scleral icterus.  Periorbital swelling.  Neck: Neck supple.  Cardiovascular: Normal rate and intact distal pulses.   Pulmonary/Chest: Effort normal. No stridor. No respiratory distress.  Abdominal: Normal appearance. He exhibits no distension.  Neurological: He is alert and oriented to person, place, and time.  Skin: Skin is warm and dry. No rash noted.  No urticaria noted but dryness noted all over.  Psychiatric: He has a normal mood and affect. His behavior is normal.    ED Course  Procedures (including critical care time) DIAGNOSTIC STUDIES: Oxygen  Saturation is 100% on RA, normal by my interpretation.   COORDINATION OF CARE: 6:14 PM- Will prescribed Pepcid, Benadryl, and Solu-Medrol and monitor to make sure pt's symptoms improve. Pt verbalizes understanding and agrees to plan.  Medications - No data to display  Labs Review Labs Reviewed - No data to display Imaging Review No results found.  EKG Interpretation   None       MDM   1. Allergic reaction, initial encounter     7:49 PM Patient feeling better after solumedrol, pepcid, and benadryl. Patient will have Bactrim instead of  Doxycycline due to allergic reaction. Vitals stable and patient afebrile.   I personally performed the services described in this documentation, which was scribed in my presence. The recorded information has been reviewed and is accurate.   Emilia Beck, PA-C 11/11/12 1950

## 2012-11-15 NOTE — ED Provider Notes (Signed)
Medical screening examination/treatment/procedure(s) were performed by non-physician practitioner and as supervising physician I was immediately available for consultation/collaboration.  EKG Interpretation   None        Donnetta Hutching, MD 11/15/12 1547

## 2014-09-18 ENCOUNTER — Encounter (HOSPITAL_COMMUNITY): Payer: Self-pay | Admitting: Emergency Medicine

## 2014-09-18 ENCOUNTER — Emergency Department (HOSPITAL_COMMUNITY): Payer: Medicaid Other

## 2014-09-18 ENCOUNTER — Emergency Department (HOSPITAL_COMMUNITY)
Admission: EM | Admit: 2014-09-18 | Discharge: 2014-09-18 | Disposition: A | Discharge status: Home / Self Care | Payer: Medicaid Other | Attending: Physician Assistant | Admitting: Physician Assistant

## 2014-09-18 DIAGNOSIS — M545 Low back pain, unspecified: Secondary | ICD-10-CM

## 2014-09-18 DIAGNOSIS — E079 Disorder of thyroid, unspecified: Secondary | ICD-10-CM | POA: Insufficient documentation

## 2014-09-18 DIAGNOSIS — Z792 Long term (current) use of antibiotics: Secondary | ICD-10-CM | POA: Diagnosis not present

## 2014-09-18 DIAGNOSIS — Z79899 Other long term (current) drug therapy: Secondary | ICD-10-CM | POA: Diagnosis not present

## 2014-09-18 DIAGNOSIS — Z8673 Personal history of transient ischemic attack (TIA), and cerebral infarction without residual deficits: Secondary | ICD-10-CM | POA: Insufficient documentation

## 2014-09-18 DIAGNOSIS — Z720 Tobacco use: Secondary | ICD-10-CM | POA: Insufficient documentation

## 2014-09-18 LAB — I-STAT CHEM 8, ED
BUN: 13 mg/dL (ref 6–20)
CALCIUM ION: 1.18 mmol/L (ref 1.12–1.23)
CHLORIDE: 104 mmol/L (ref 101–111)
CREATININE: 1 mg/dL (ref 0.61–1.24)
GLUCOSE: 76 mg/dL (ref 65–99)
HCT: 39 % (ref 39.0–52.0)
Hemoglobin: 13.3 g/dL (ref 13.0–17.0)
Potassium: 4.3 mmol/L (ref 3.5–5.1)
Sodium: 141 mmol/L (ref 135–145)
TCO2: 25 mmol/L (ref 0–100)

## 2014-09-18 LAB — URINALYSIS, ROUTINE W REFLEX MICROSCOPIC
BILIRUBIN URINE: NEGATIVE
GLUCOSE, UA: NEGATIVE mg/dL
HGB URINE DIPSTICK: NEGATIVE
Ketones, ur: NEGATIVE mg/dL
Leukocytes, UA: NEGATIVE
Nitrite: NEGATIVE
PH: 7 (ref 5.0–8.0)
Protein, ur: NEGATIVE mg/dL
SPECIFIC GRAVITY, URINE: 1.016 (ref 1.005–1.030)
Urobilinogen, UA: 1 mg/dL (ref 0.0–1.0)

## 2014-09-18 MED ORDER — ONDANSETRON 4 MG PO TBDP
4.0000 mg | ORAL_TABLET | Freq: Once | ORAL | Status: AC
  Administered 2014-09-18: 4 mg via ORAL
  Filled 2014-09-18: qty 1

## 2014-09-18 MED ORDER — OXYCODONE-ACETAMINOPHEN 5-325 MG PO TABS
2.0000 | ORAL_TABLET | Freq: Once | ORAL | Status: AC
Start: 2014-09-18 — End: 2014-09-18
  Administered 2014-09-18: 2 via ORAL
  Filled 2014-09-18: qty 2

## 2014-09-18 MED ORDER — KETOROLAC TROMETHAMINE 60 MG/2ML IM SOLN
60.0000 mg | Freq: Once | INTRAMUSCULAR | Status: AC
  Administered 2014-09-18: 60 mg via INTRAMUSCULAR
  Filled 2014-09-18: qty 2

## 2014-09-18 MED ORDER — DEXAMETHASONE SODIUM PHOSPHATE 10 MG/ML IJ SOLN
10.0000 mg | Freq: Once | INTRAMUSCULAR | Status: AC
  Administered 2014-09-18: 10 mg via INTRAMUSCULAR
  Filled 2014-09-18: qty 1

## 2014-09-18 MED ORDER — OXYCODONE-ACETAMINOPHEN 5-325 MG PO TABS
1.0000 | ORAL_TABLET | ORAL | Status: DC | PRN
Start: 2014-09-18 — End: 2014-10-02

## 2014-09-18 NOTE — ED Notes (Signed)
Bed: WTR6 Expected date:  Expected time:  Means of arrival:  Comments: 

## 2014-09-18 NOTE — Discharge Instructions (Signed)
SEEK IMMEDIATE MEDICAL ATTENTION IF: °New numbness, tingling, weakness, or problem with the use of your arms or legs.  °Severe back pain not relieved with medications.  °Change in bowel or bladder control.  °Increasing pain in any areas of the body (such as chest or abdominal pain).  °Shortness of breath, dizziness or fainting.  °Nausea (feeling sick to your stomach), vomiting, fever, or sweats. ° °Back Pain, Adult °Low back pain is very common. About 1 in 5 people have back pain. The cause of low back pain is rarely dangerous. The pain often gets better over time. About half of people with a sudden onset of back pain feel better in just 2 weeks. About 8 in 10 people feel better by 6 weeks.  °CAUSES °Some common causes of back pain include: °· Strain of the muscles or ligaments supporting the spine. °· Wear and tear (degeneration) of the spinal discs. °· Arthritis. °· Direct injury to the back. °DIAGNOSIS °Most of the time, the direct cause of low back pain is not known. However, back pain can be treated effectively even when the exact cause of the pain is unknown. Answering your caregiver's questions about your overall health and symptoms is one of the most accurate ways to make sure the cause of your pain is not dangerous. If your caregiver needs more information, he or she may order lab work or imaging tests (X-rays or MRIs). However, even if imaging tests show changes in your back, this usually does not require surgery. °HOME CARE INSTRUCTIONS °For many people, back pain returns. Since low back pain is rarely dangerous, it is often a condition that people can learn to manage on their own.  °· Remain active. It is stressful on the back to sit or stand in one place. Do not sit, drive, or stand in one place for more than 30 minutes at a time. Take short walks on level surfaces as soon as pain allows. Try to increase the length of time you walk each day. °· Do not stay in bed. Resting more than 1 or 2 days can delay  your recovery. °· Do not avoid exercise or work. Your body is made to move. It is not dangerous to be active, even though your back may hurt. Your back will likely heal faster if you return to being active before your pain is gone. °· Pay attention to your body when you  bend and lift. Many people have less discomfort when lifting if they bend their knees, keep the load close to their bodies, and avoid twisting. Often, the most comfortable positions are those that put less stress on your recovering back. °· Find a comfortable position to sleep. Use a firm mattress and lie on your side with your knees slightly bent. If you lie on your back, put a pillow under your knees. °· Only take over-the-counter or prescription medicines as directed by your caregiver. Over-the-counter medicines to reduce pain and inflammation are often the most helpful. Your caregiver may prescribe muscle relaxant drugs. These medicines help dull your pain so you can more quickly return to your normal activities and healthy exercise. °· Put ice on the injured area. °¨ Put ice in a plastic bag. °¨ Place a towel between your skin and the bag. °¨ Leave the ice on for 15-20 minutes, 03-04 times a day for the first 2 to 3 days. After that, ice and heat may be alternated to reduce pain and spasms. °· Ask your caregiver about trying back exercises and gentle massage. This may be of some benefit. °· Avoid feeling anxious or stressed. Stress   increases muscle tension and can worsen back pain. It is important to recognize when you are anxious or stressed and learn ways to manage it. Exercise is a great option. °SEEK MEDICAL CARE IF: °· You have pain that is not relieved with rest or medicine. °· You have pain that does not improve in 1 week. °· You have new symptoms. °· You are generally not feeling well. °SEEK IMMEDIATE MEDICAL CARE IF:  °· You have pain that radiates from your back into your legs. °· You develop new bowel or bladder control  problems. °· You have unusual weakness or numbness in your arms or legs. °· You develop nausea or vomiting. °· You develop abdominal pain. °· You feel faint. °Document Released: 12/22/2004 Document Revised: 06/23/2011 Document Reviewed: 04/25/2013 °Exit[employer]® Patient Information ©2015 [employer]. This information is not intended to replace advice given to you by your health care provider. Make sure you discuss any questions you have with your health care provider. ° °

## 2014-09-18 NOTE — ED Notes (Signed)
Awake. Verbally responsive. A/O x4. Resp even and unlabored. No audible adventitious breath sounds noted. ABC's intact.  

## 2014-09-18 NOTE — ED Notes (Signed)
Bed: WTR9 Expected date:  Expected time:  Means of arrival:  Comments: 
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
## 2014-09-18 NOTE — ED Provider Notes (Signed)
CSN: [id number]     Arrival date & time 09/18/14  4098 History   First MD Initiated Contact with Patient 09/18/14 [phone]     Chief Complaint  Patient presents with  . Back Pain     (Consider location/radiation/quality/duration/timing/severity/associated sxs/prior Treatment) HPI   George Floyd Is a 49 year old male who presents to the emergency department with chief complaint of left lower back pain. Patient states that he "threw his back out again." Patient states that this yesterday morning when he got out of bed he had pain in the left lower back. It has progressively worsened since that time. He has pain with all movement. Unable to sit upright. He denies any radiation down the back of the leg. Patient denies weakness, numbness, saddle anesthesia, recent procedures to back. He has previous episodes of self-limited lower back pain and spasm. He denies any known inciting injury. He denies urinary symptoms or history of kidney stones. However, review of the patient's chart shows he had a lithotripsy with retrograde cystoscopy and basket extraction of previous stones. Occurred in 2014.  Past Medical History  Diagnosis Date  . Thyroid disease   . 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 Back pain    Past Surgical History  Procedure Laterality Date  . Cystoscopy/retrograde/ureteroscopy/stone extraction with basket Right 04/15/2012    Procedure: CYSTOSCOPY/RETROGRADE/URETEROSCOPY/STONE EXTRACTION WITH BASKET;  Surgeon: Marcine Matar, MD;  Location: [facility];  Service: Urology;  Laterality: Right;  . Holmium laser application Right 04/15/2012    Procedure: HOLMIUM LASER APPLICATION;  Surgeon: Marcine Matar, MD;  Location: [facility];  Service: Urology;  Laterality: Right;   No family history on file. Social History  Substance Use Topics  . Smoking status: Current Every Day Smoker -- 0.50 packs/day    Types: Cigarettes  . Smokeless tobacco: None  . Alcohol Use: No    Review of  Systems  Ten systems reviewed and are negative for acute change, except as noted in the HPI.   Allergies  Review of patient's allergies indicates no known allergies.  Home Medications   Prior to Admission medications   Medication Sig Start Date End Date Taking? Authorizing Provider  albuterol (PROVENTIL HFA;VENTOLIN HFA) 108 (90 BASE) MCG/ACT inhaler Inhale 2 puffs into the lungs every 4 (four) hours as needed for wheezing.    Historical Provider, MD  doxycycline (VIBRA-TABS) 100 MG tablet Take 100 mg by mouth 2 (two) times daily.    Historical Provider, MD  levothyroxine (SYNTHROID, LEVOTHROID) 200 MCG tablet Take 200 mcg by mouth daily before breakfast. *takes with tablet for dose    Historical Provider, MD  levothyroxine (SYNTHROID, LEVOTHROID) 50 MCG tablet Take 50 mcg by mouth daily before breakfast. *takes together with tablet for dose    Historical Provider, MD  methocarbamol (ROBAXIN) 500 MG tablet Take 500 mg by mouth 2 (two) times daily.    Historical Provider, MD  sulfamethoxazole-trimethoprim (SEPTRA DS) 800-160 MG per tablet Take 1 tablet by mouth every 12 (twelve) hours. 11/11/12   Kaitlyn Szekalski, PA-C   BP 145/83 mmHg  Pulse 81  Temp(Src) 97.4 F (36.3 C) (Oral)  Resp 18  SpO2 100% Physical Exam  Constitutional: He appears well-developed and well-nourished. No distress.  HENT:  Head: Normocephalic and atraumatic.  Eyes: Conjunctivae are normal. No scleral icterus.  Neck: Normal range of motion. Neck supple.  Cardiovascular: Normal rate, regular rhythm and normal heart sounds.   Pulmonary/Chest: Effort normal and  breath sounds normal. No respiratory distress.  Abdominal: Soft. He exhibits no distension. There is no tenderness.  Musculoskeletal: He exhibits tenderness. He exhibits no edema.  Patient with exquisite tenderness to palpation of the lower back per [facility] on the left lower portion. CVA tenderness is a critical due to the level of  the patient's pain. I have ordered a urinalysis. Patient given Decadron and Toradol.  Neurological: He is alert.  Skin: Skin is warm and dry. He is not diaphoretic.  Psychiatric: His behavior is normal.  Nursing note and vitals reviewed.   ED Course  Procedures (including critical care time) Labs Review Labs Reviewed - No data to display  Imaging Review No results found. I have personally reviewed and evaluated these images and lab results as part of my medical decision-making.   EKG Interpretation None      MDM   Final diagnoses:  Left-sided low back pain without sciatica    Patient with back pain.  No neurological deficits and normal neuro exam.  Patient can walk but states is painful.  No loss of bowel or bladder control.  No concern for cauda equina.  No fever, night sweats, weight loss, h/o cancer, IVDU.  RICE protocol and pain medicine indicated and discussed with patient.      Arthor Captain, PA-C 09/22/14 0133  Arthor Captain, PA-C 09/22/14 0133  Courteney Randall An, MD 09/22/14 [phone]

## 2014-10-02 ENCOUNTER — Emergency Department (HOSPITAL_COMMUNITY)
Admission: EM | Admit: 2014-10-02 | Discharge: 2014-10-02 | Disposition: A | Discharge status: Home / Self Care | Payer: Medicaid Other | Attending: Emergency Medicine | Admitting: Emergency Medicine

## 2014-10-02 ENCOUNTER — Encounter (HOSPITAL_COMMUNITY): Payer: Self-pay | Admitting: Emergency Medicine

## 2014-10-02 DIAGNOSIS — Z79899 Other long term (current) drug therapy: Secondary | ICD-10-CM | POA: Diagnosis not present

## 2014-10-02 DIAGNOSIS — Z8673 Personal history of transient ischemic attack (TIA), and cerebral infarction without residual deficits: Secondary | ICD-10-CM | POA: Diagnosis not present

## 2014-10-02 DIAGNOSIS — M545 Low back pain: Secondary | ICD-10-CM | POA: Insufficient documentation

## 2014-10-02 DIAGNOSIS — Z7951 Long term (current) use of inhaled steroids: Secondary | ICD-10-CM | POA: Insufficient documentation

## 2014-10-02 DIAGNOSIS — Z720 Tobacco use: Secondary | ICD-10-CM | POA: Diagnosis not present

## 2014-10-02 DIAGNOSIS — E079 Disorder of thyroid, unspecified: Secondary | ICD-10-CM | POA: Diagnosis not present

## 2014-10-02 MED ORDER — OXYCODONE-ACETAMINOPHEN 5-325 MG PO TABS: 1.0000 | ORAL_TABLET | Freq: Four times a day (QID) | ORAL | Status: DC | PRN

## 2014-10-02 MED ORDER — CYCLOBENZAPRINE HCL 10 MG PO TABS: 10.0000 mg | ORAL_TABLET | Freq: Three times a day (TID) | ORAL | Status: DC | PRN

## 2014-10-02 MED ORDER — KETOROLAC TROMETHAMINE 60 MG/2ML IM SOLN
60.0000 mg | Freq: Once | INTRAMUSCULAR | Status: AC
  Administered 2014-10-02: 60 mg via INTRAMUSCULAR
  Filled 2014-10-02: qty 2

## 2014-10-02 MED ORDER — HYDROMORPHONE HCL 2 MG/ML IJ SOLN
2.0000 mg | Freq: Once | INTRAMUSCULAR | Status: AC
  Administered 2014-10-02: 2 mg via INTRAMUSCULAR
  Filled 2014-10-02: qty 1

## 2014-10-02 MED ORDER — NAPROXEN 500 MG PO TABS: 500.0000 mg | ORAL_TABLET | Freq: Two times a day (BID) | ORAL | Status: DC | PRN

## 2014-10-02 NOTE — ED Provider Notes (Signed)
CSN: [id number]     Arrival date & time 10/02/14  4098 History   First MD Initiated Contact with Patient 10/02/14 [phone]     Chief Complaint  Patient presents with  . Back Pain    49 yom PMHx kidney stones and low back pain with sciatica presents for worsening of low back pain with radiation to left thigh. States it feels similar to when he was here a couple weeks ago. He followed up with Ortho and received a cortisone shot but pain is back now.      (Consider location/radiation/quality/duration/timing/severity/associated sxs/prior Treatment) HPI  49 year old male presents with back pain for 2 weeks. Started when he was seen here on 9/13. Pain has been continuous since then. Worsened this morning when he was walking to the bathroom and is back "gave out on me". Patient states that since discharge he has been having to use the pain medicine multiple times per day as is the only thing that helps. No injuries to his back. Patient has not seen a spine specialist at least one year, then they gave him a cortisone shot which is helped the pain but the cortisone shot hurt so bad that he doesn't want another one. Has not had any fevers, abdominal pain, urinary symptoms including no hematuria, or weakness/numbness. Pain radiates down his left buttock to his thigh. Rates his pain as severe. Has never had surgery on his back. This feels different than his pain is had with kidney stones before.  Past Medical History  Diagnosis Date  . Thyroid disease   . 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 Back pain    Past Surgical History  Procedure Laterality Date  . Cystoscopy/retrograde/ureteroscopy/stone extraction with basket Right 04/15/2012    Procedure: CYSTOSCOPY/RETROGRADE/URETEROSCOPY/STONE EXTRACTION WITH BASKET;  Surgeon: Marcine Matar, MD;  Location: [facility];  Service: Urology;  Laterality: Right;  . Holmium laser application Right 04/15/2012    Procedure: HOLMIUM LASER APPLICATION;  Surgeon:  Marcine Matar, MD;  Location: [facility];  Service: Urology;  Laterality: Right;   History reviewed. No pertinent family history. Social History  Substance Use Topics  . Smoking status: Current Every Day Smoker -- 0.50 packs/day    Types: Cigarettes  . Smokeless tobacco: None  . Alcohol Use: No    Review of Systems  Constitutional: Negative for fever.  Gastrointestinal: Negative for vomiting and abdominal pain.  Genitourinary: Negative for dysuria and hematuria.  Musculoskeletal: Positive for back pain.  Neurological: Negative for weakness and numbness.  All other systems reviewed and are negative.     Allergies  Review of patient's allergies indicates no known allergies.  Home Medications   Prior to Admission medications   Medication Sig Start Date End Date Taking? Authorizing Provider  albuterol (PROVENTIL HFA;VENTOLIN HFA) 108 (90 BASE) MCG/ACT inhaler Inhale 2 puffs into the lungs every 4 (four) hours as needed for wheezing.    Historical Provider, MD  cyclobenzaprine (FLEXERIL) 10 MG tablet Take 10 mg by mouth at bedtime as needed for muscle spasms.    Historical Provider, MD  fluticasone (FLONASE) 50 MCG/ACT nasal spray Place 1 spray into both nostrils daily as needed for allergies or rhinitis.    Historical Provider, MD  levothyroxine (SYNTHROID, LEVOTHROID) 200 MCG tablet Take 200 mcg by mouth daily before breakfast.     Historical Provider, MD  loratadine (CLARITIN) 10 MG tablet Take 10 mg by mouth daily.    Historical Provider, MD  naproxen (NAPROSYN)  500 MG tablet Take 500 mg by mouth 2 (two) times daily as needed for mild pain or moderate pain.    Historical Provider, MD  oxyCODONE-acetaminophen (PERCOCET) 5-325 MG per tablet Take 1-2 tablets by mouth every 4 (four) hours as needed. 09/18/14   Arthor Captain, PA-C  sulfamethoxazole-trimethoprim (SEPTRA DS) 800-160 MG per tablet Take 1 tablet by mouth every 12 (twelve) hours. Patient not taking: Reported on 09/18/2014  11/11/12   Kaitlyn Szekalski, PA-C   BP 108/74 mmHg  Pulse 62  Temp(Src) 97.6 F (36.4 C) (Oral)  Resp 18  Ht 6' (1.829 m)  Wt 180 lb (81.647 kg)  BMI 24.41 kg/m2  SpO2 98% Physical Exam  Constitutional: He is oriented to person, place, and time. He appears well-developed and well-nourished. No distress.  HENT:  Head: Normocephalic and atraumatic.  Right Ear: External ear normal.  Left Ear: External ear normal.  Nose: Nose normal.  Eyes: Right eye exhibits no discharge. Left eye exhibits no discharge.  Neck: Neck supple.  Cardiovascular: Normal rate, regular rhythm, normal heart sounds and intact distal pulses.   Pulmonary/Chest: Effort normal and breath sounds normal.  Abdominal: Soft. He exhibits no distension. There is no tenderness.  Musculoskeletal: He exhibits no edema.       Lumbar back: He exhibits tenderness (midline, low lumbar).  Neurological: He is alert and oriented to person, place, and time. He displays no Babinski's sign on the right side. He displays no Babinski's sign on the left side.  Reflex Scores:      Patellar reflexes are 2+ on the right side and 2+ on the left side.      Achilles reflexes are 2+ on the right side and 2+ on the left side. 5/5 strength in bilateral lower extremities  Skin: Skin is warm and dry. He is not diaphoretic.  Nursing note and vitals reviewed.   ED Course  Procedures (including critical care time) Labs Review Labs Reviewed - No data to display  Imaging Review No results found. I have personally reviewed and evaluated these images and lab results as part of my medical decision-making.   EKG Interpretation None      MDM   Final diagnoses:  Left low back pain, with sciatica presence unspecified    Patient with acute on chronic low back pain. No signs or symptoms of a neurologic emergency, do not feel MRI is indicated at this time. May need an MRI as an outpatient. I've encouraged him to follow-up with spine specialist  as outpatient as well as his PCP. Feels better after treatment in the ER. Low suspicion this is renal colic given continuous pain, no CVA tenderness/pain, no abdominal symptoms, and localized pain to his lumbar spine. X-ray 2 weeks ago was unremarkable, no indication for x-ray imaging with no new trauma. Stable for discharge home with return precautions.    Pricilla Loveless, MD 10/02/14 [phone]

## 2014-10-02 NOTE — Discharge Instructions (Signed)

## 2014-10-02 NOTE — ED Notes (Addendum)
49 yom PMHx kidney stones and low back pain with sciatica presents for worsening of low back pain with radiation to left thigh. States it feels similar to when he was here a couple weeks ago. He followed up with Ortho and received a cortisone shot but pain is back now.   Assessment completed and charted in flowsheets.

## 2014-10-02 NOTE — ED Notes (Signed)
MD at bedside. 

## 2014-10-27 ENCOUNTER — Emergency Department (HOSPITAL_COMMUNITY)
Admission: EM | Admit: 2014-10-27 | Discharge: 2014-10-27 | Disposition: A | Discharge status: Home / Self Care | Payer: Medicaid Other | Attending: Emergency Medicine | Admitting: Emergency Medicine

## 2014-10-27 ENCOUNTER — Encounter (HOSPITAL_COMMUNITY): Payer: Self-pay | Admitting: Emergency Medicine

## 2014-10-27 ENCOUNTER — Emergency Department (HOSPITAL_COMMUNITY): Payer: Medicaid Other

## 2014-10-27 DIAGNOSIS — Z720 Tobacco use: Secondary | ICD-10-CM | POA: Insufficient documentation

## 2014-10-27 DIAGNOSIS — R002 Palpitations: Secondary | ICD-10-CM

## 2014-10-27 DIAGNOSIS — Z79899 Other long term (current) drug therapy: Secondary | ICD-10-CM | POA: Insufficient documentation

## 2014-10-27 DIAGNOSIS — R251 Tremor, unspecified: Secondary | ICD-10-CM | POA: Insufficient documentation

## 2014-10-27 DIAGNOSIS — E079 Disorder of thyroid, unspecified: Secondary | ICD-10-CM | POA: Insufficient documentation

## 2014-10-27 DIAGNOSIS — Z8673 Personal history of transient ischemic attack (TIA), and cerebral infarction without residual deficits: Secondary | ICD-10-CM | POA: Diagnosis not present

## 2014-10-27 LAB — I-STAT TROPONIN, ED: TROPONIN I, POC: 0 ng/mL (ref 0.00–0.08)

## 2014-10-27 LAB — CBC
HCT: 42 % (ref 39.0–52.0)
Hemoglobin: 14 g/dL (ref 13.0–17.0)
MCH: 30.8 pg (ref 26.0–34.0)
MCHC: 33.3 g/dL (ref 30.0–36.0)
MCV: 92.5 fL (ref 78.0–100.0)
PLATELETS: 245 10*3/uL (ref 150–400)
RBC: 4.54 MIL/uL (ref 4.22–5.81)
RDW: 14.7 % (ref 11.5–15.5)
WBC: 3.7 10*3/uL — ABNORMAL LOW (ref 4.0–10.5)

## 2014-10-27 LAB — BASIC METABOLIC PANEL
Anion gap: 7 (ref 5–15)
BUN: 13 mg/dL (ref 6–20)
CHLORIDE: 106 mmol/L (ref 101–111)
CO2: 24 mmol/L (ref 22–32)
CREATININE: 1.01 mg/dL (ref 0.61–1.24)
Calcium: 9.2 mg/dL (ref 8.9–10.3)
GFR calc Af Amer: >60 mL/min (ref >60)
GFR calc non Af Amer: >60 mL/min (ref >60)
GLUCOSE: 80 mg/dL (ref 65–99)
POTASSIUM: 3.5 mmol/L (ref 3.5–5.1)
Sodium: 137 mmol/L (ref 135–145)

## 2014-10-27 MED ORDER — SODIUM CHLORIDE 0.9 % IV SOLN
INTRAVENOUS | Status: DC
  Administered 2014-10-27: 17:00:00 via INTRAVENOUS

## 2014-10-27 NOTE — ED Provider Notes (Signed)
CSN: [id number]     Arrival date & time 10/27/14  1537 History   First MD Initiated Contact with Patient 10/27/14 1604     Chief Complaint  Patient presents with  . Palpitations   HPI Patient presents to the emergency room with a complaint of palpitations.  Patient was watching TV when suddenly his heart started beating fast. This lasted a be at least an hour. He felt like his heart was pounding. He was not having any pain. He started to feel somewhat short of breath. The symptoms have slowly resolved. He denies any trouble with nausea or vomiting. He has noticed some shaking in his right hand after that started. He denies any numbness or weakness. EMS gave the patient aspirin and nitroglycerin. Patient again tells me that he was not having pain but it did feel his heart pounding. Past Medical History  Diagnosis Date  . Thyroid disease   . 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 Back pain    Past Surgical History  Procedure Laterality Date  . Cystoscopy/retrograde/ureteroscopy/stone extraction with basket Right 04/15/2012    Procedure: CYSTOSCOPY/RETROGRADE/URETEROSCOPY/STONE EXTRACTION WITH BASKET;  Surgeon: Marcine Matar, MD;  Location: [facility];  Service: Urology;  Laterality: Right;  . Holmium laser application Right 04/15/2012    Procedure: HOLMIUM LASER APPLICATION;  Surgeon: Marcine Matar, MD;  Location: [facility];  Service: Urology;  Laterality: Right;   No family history on file. Social History  Substance Use Topics  . Smoking status: Current Every Day Smoker -- 0.50 packs/day    Types: Cigarettes  . Smokeless tobacco: None  . Alcohol Use: No    Review of Systems  Constitutional: Negative for fever.  Respiratory: Negative for wheezing.   Genitourinary: Negative for dysuria.  Neurological: Negative for light-headedness.      Allergies  Review of patient's allergies indicates no known allergies.  Home Medications   Prior to Admission medications    Medication Sig Start Date End Date Taking? Authorizing Provider  cyclobenzaprine (FLEXERIL) 10 MG tablet Take 1 tablet (10 mg total) by mouth 3 (three) times daily as needed for muscle spasms. Patient taking differently: Take 10 mg by mouth at bedtime as needed for muscle spasms.  10/02/14  Yes Pricilla Loveless, MD  fluticasone (FLONASE) 50 MCG/ACT nasal spray Place 1 spray into both nostrils daily as needed for allergies or rhinitis.   Yes Historical Provider, MD  levothyroxine (SYNTHROID, LEVOTHROID) 200 MCG tablet Take 200 mcg by mouth daily before breakfast.    Yes Historical Provider, MD  loratadine (CLARITIN) 10 MG tablet Take 10 mg by mouth daily.   Yes Historical Provider, MD  naproxen (NAPROSYN) 500 MG tablet Take 1 tablet (500 mg total) by mouth 2 (two) times daily as needed for mild pain or moderate pain. 10/02/14  Yes Pricilla Loveless, MD  oxyCODONE-acetaminophen (PERCOCET) 5-325 MG per tablet Take 1 tablet by mouth every 6 (six) hours as needed for severe pain. 10/02/14  Yes Pricilla Loveless, MD   BP 168/153 mmHg  Pulse 49  Temp(Src) 97.4 F (36.3 C) (Oral)  Resp 16  Ht 6' (1.829 m)  SpO2 100% Physical Exam  Constitutional: He appears well-developed and well-nourished. No distress.  HENT:  Head: Normocephalic and atraumatic.  Right Ear: External ear normal.  Left Ear: External ear normal.  Eyes: Conjunctivae are normal. Right eye exhibits no discharge. Left eye exhibits no discharge. No scleral icterus.  Neck: Neck supple. No tracheal deviation present.  Cardiovascular: Normal rate, regular rhythm and intact distal pulses.   Pulmonary/Chest: Effort normal and breath sounds normal. No stridor. No respiratory distress. He has no wheezes. He has no rales.  Abdominal: Soft. Bowel sounds are normal. He exhibits no distension. There is no tenderness. There is no rebound and no guarding.  Musculoskeletal: He exhibits no edema or tenderness.  Neurological: He is alert. He has normal  strength. He displays tremor. No cranial nerve deficit (no facial droop, extraocular movements intact, no slurred speech) or sensory deficit. He exhibits normal muscle tone. He displays no seizure activity. Coordination normal.  Mild resting tremor in the right arm and hand,  Skin: Skin is warm and dry. No rash noted. He is not diaphoretic.  Psychiatric: He has a normal mood and affect.  Nursing note and vitals reviewed.   ED Course  Procedures (including critical care time) Labs Review Labs Reviewed  CBC - Abnormal; Notable for the following:    WBC 3.7 (*)    All other components within normal limits  BASIC METABOLIC PANEL  [facility] SensorI-STAT TROPOININ, ED    Imaging Review Dg Chest 2 View  10/27/2014  CLINICAL DATA:  49 year old male with acute chest pain. EXAM: CHEST  2 VIEW COMPARISON:  04/12/2012 and prior radiographs FINDINGS: The cardiomediastinal silhouette is unremarkable. Mild peribronchial thickening is unchanged. There is no evidence of focal airspace disease, pulmonary edema, suspicious pulmonary nodule/mass, pleural effusion, or pneumothorax. No acute bony abnormalities are identified. IMPRESSION: No active cardiopulmonary disease. Electronically Signed   By: Harmon PierJeffrey  Hu M.D.   On: 10/27/2014 16:33   I have personally reviewed and evaluated these images and lab results as part of my medical decision-making.   EKG Interpretation   Date/Time:  Saturday October 27 2014 15:45:53 EDT Ventricular Rate:  58 PR Interval:  172 QRS Duration: 93 QT Interval:  415 QTC Calculation: 408 R Axis:   -50 Text Interpretation:  Poor data quality Sinus rhythm Left axis deviation  No significant change since last tracing Confirmed by Tiyana Galla  MD-J, Tyah Acord  ([id number]) on 10/27/2014 4:06:21 PM      MDM   Final diagnoses:  Palpitations    Pt t had palpitations earlier in the day.  Denied have chest pain.  He has been sx free in the ED.  No ectopy noted on the monitor.  Normal EKG.  Labs and xrays  are normal.  At this time there does not appear to be any evidence of an acute emergency medical condition and the patient appears stable for discharge with appropriate outpatient follow up.     Linwood DibblesJon Johnae Friley, MD 10/27/14 1800

## 2014-10-27 NOTE — ED Notes (Signed)
Patient comes in today with complaints of Chest Pain. States he was watching TV and "my heart started beating fast". Patient states he began to experience SOB denies any N/V. Patient does complains of shaking to right side. Patient was given 324 ASA and 2 Nitro . Patient rates pain 2/10 on arrival. Patient Alert and Oriented x 4 neuro intact. Able to move all extremities.

## 2014-10-27 NOTE — Discharge Instructions (Signed)

## 2015-06-05 ENCOUNTER — Other Ambulatory Visit: Payer: Self-pay | Admitting: Specialist

## 2015-06-05 DIAGNOSIS — M5126 Other intervertebral disc displacement, lumbar region: Secondary | ICD-10-CM

## 2015-07-05 ENCOUNTER — Other Ambulatory Visit: Payer: Self-pay | Admitting: Specialist

## 2015-07-05 DIAGNOSIS — M48061 Spinal stenosis, lumbar region without neurogenic claudication: Secondary | ICD-10-CM

## 2015-07-17 ENCOUNTER — Other Ambulatory Visit: Payer: Medicaid Other

## 2016-01-08 ENCOUNTER — Encounter (HOSPITAL_COMMUNITY): Payer: Self-pay | Admitting: Emergency Medicine

## 2016-01-08 ENCOUNTER — Emergency Department (HOSPITAL_COMMUNITY)
Admission: EM | Admit: 2016-01-08 | Discharge: 2016-01-08 | Disposition: A | Discharge status: Home / Self Care | Payer: Medicaid Other | Attending: Emergency Medicine | Admitting: Emergency Medicine

## 2016-01-08 ENCOUNTER — Emergency Department (HOSPITAL_COMMUNITY): Payer: Medicaid Other

## 2016-01-08 DIAGNOSIS — Z8673 Personal history of transient ischemic attack (TIA), and cerebral infarction without residual deficits: Secondary | ICD-10-CM | POA: Diagnosis not present

## 2016-01-08 DIAGNOSIS — E039 Hypothyroidism, unspecified: Secondary | ICD-10-CM | POA: Diagnosis not present

## 2016-01-08 DIAGNOSIS — R05 Cough: Secondary | ICD-10-CM

## 2016-01-08 DIAGNOSIS — M5442 Lumbago with sciatica, left side: Secondary | ICD-10-CM | POA: Insufficient documentation

## 2016-01-08 DIAGNOSIS — R059 Cough, unspecified: Secondary | ICD-10-CM

## 2016-01-08 DIAGNOSIS — F17210 Nicotine dependence, cigarettes, uncomplicated: Secondary | ICD-10-CM | POA: Diagnosis not present

## 2016-01-08 DIAGNOSIS — M545 Low back pain: Secondary | ICD-10-CM | POA: Diagnosis present

## 2016-01-08 MED ORDER — OXYCODONE-ACETAMINOPHEN 5-325 MG PO TABS: 1.0000 | ORAL_TABLET | ORAL | 0 refills | Status: DC | PRN

## 2016-01-08 MED ORDER — NAPROXEN 500 MG PO TABS: 500.0000 mg | ORAL_TABLET | Freq: Two times a day (BID) | ORAL | 0 refills | Status: DC

## 2016-01-08 MED ORDER — DEXAMETHASONE SODIUM PHOSPHATE 10 MG/ML IJ SOLN
10.0000 mg | Freq: Once | INTRAMUSCULAR | Status: AC
  Administered 2016-01-08: 10 mg via INTRAMUSCULAR
  Filled 2016-01-08: qty 1

## 2016-01-08 MED ORDER — KETOROLAC TROMETHAMINE 60 MG/2ML IM SOLN
60.0000 mg | Freq: Once | INTRAMUSCULAR | Status: AC
  Administered 2016-01-08: 60 mg via INTRAMUSCULAR
  Filled 2016-01-08: qty 2

## 2016-01-08 MED ORDER — OXYCODONE-ACETAMINOPHEN 5-325 MG PO TABS
1.0000 | ORAL_TABLET | Freq: Once | ORAL | Status: AC
  Administered 2016-01-08: 1 via ORAL
  Filled 2016-01-08: qty 1

## 2016-01-08 MED ORDER — CYCLOBENZAPRINE HCL 10 MG PO TABS
10.0000 mg | ORAL_TABLET | Freq: Once | ORAL | Status: AC
  Administered 2016-01-08: 10 mg via ORAL
  Filled 2016-01-08: qty 1

## 2016-01-08 MED ORDER — CYCLOBENZAPRINE HCL 10 MG PO TABS: 10.0000 mg | ORAL_TABLET | Freq: Two times a day (BID) | ORAL | 0 refills | Status: DC | PRN

## 2016-01-08 MED ORDER — GUAIFENESIN ER 600 MG PO TB12: 600.0000 mg | ORAL_TABLET | Freq: Two times a day (BID) | ORAL | 0 refills | Status: DC

## 2016-01-08 NOTE — ED Notes (Signed)
PT DISCHARGED. INSTRUCTIONS AND PRESCRIPTIONS GIVEN. AAOX4. PT IN NO APPARENT DISTRESS. THE OPPORTUNITY TO ASK QUESTIONS WAS PROVIDED. 

## 2016-01-08 NOTE — ED Triage Notes (Signed)
Patient reports productive cough since last night. Patient also reports left arm and left leg pain. Patient ambulatory to triage. States "to be honest it may be arthritis because that is what my doctor said."

## 2016-01-08 NOTE — Discharge Instructions (Signed)
Please follow up with your primary care doctor in 1 week. Take flexeril, percocet, and naproxen for pain.  Take Mucinex for your cough.  Return to the ED for any new or worsening symptoms.

## 2016-01-08 NOTE — ED Provider Notes (Signed)
[facility]-EMERGENCY DEPT Provider Note   CSN: [id number] Arrival date & time: 01/08/16  1423  By signing my name below, I, Sonum Patel, attest that this documentation has been prepared under the direction and in the presence of Cheri FowlerKayla Lugene Hitt, PA-C. Electronically Signed: Sonum Patel, Neurosurgeoncribe. 01/08/16. 2:53 PM.  History   Chief Complaint Chief Complaint  Patient presents with  . Cough  . Arm Pain  . Leg Pain   The history is provided by the patient. No language interpreter was used.     HPI Comments: George Floyd is a 51 y.o. male who presents to the Emergency Department complaining of a persistent cough productive of [place] sputum that began yesterday. He has associated rhinorrhea. He has taken pseudoephedrine and used Vicks with mild relief. He denies CP, SOB, fever, nasal congestions. He is a 0.5 ppd smoker.   He has a secondary complaints of LUE, left lower back, and LLE pain that has been ongoing for many months. He states the lower extremity pain begins in the left lower back with radiation down the posterior left leg. He states this morning he attempted to get out of bed when he had pain, causing him to fall back in bed. He describes the pain as cramping. He has been seen by his PCP for this and was diagnosed with arthritis and prescribed to take an unspecified pain medication which has not been helpful. He denies head injury or LOC. He denies any known trauma or injury to the affected areas. He does report a hx of chronic back pain with sciatica.   Past Medical History:  Diagnosis Date  . Back pain   . Stroke 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 Thyroid disease     Patient Active Problem List   Diagnosis Date Noted  . Hypothyroidism 10/26/2012  . Syncope- neurally mediated 10/25/2012  . Sinus bradycardia 10/25/2012  . Anxiety 10/25/2012  . 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 HEMORRHOIDS, EXTERNAL 10/25/2006  . SYMPTOM, ABNORMAL LOSS OF WEIGHT 10/25/2006    Past Surgical  History:  Procedure Laterality Date  . CYSTOSCOPY/RETROGRADE/URETEROSCOPY/STONE EXTRACTION WITH BASKET Right 04/15/2012   Procedure: CYSTOSCOPY/RETROGRADE/URETEROSCOPY/STONE EXTRACTION WITH BASKET;  Surgeon: Marcine MatarStephen Dahlstedt, MD;  Location: [facility] ORS;  Service: Urology;  Laterality: Right;  . HOLMIUM LASER APPLICATION Right 04/15/2012   Procedure: HOLMIUM LASER APPLICATION;  Surgeon: Marcine MatarStephen Dahlstedt, MD;  Location: [facility] OR[facility];  Service: Urology;  Laterality: Right;       Home Medications    Prior to Admission medications   Medication Sig Start Date End Date Taking? Authorizing Provider  cyclobenzaprine (FLEXERIL) 10 MG tablet Take 1 tablet (10 mg total) by mouth 2 (two) times daily as needed for muscle spasms. 01/08/16   Simara Rhyner, PA-C  fluticasone (FLONASE) 50 MCG/ACT nasal spray Place 1 spray into both nostrils daily as needed for allergies or rhinitis.    Historical Provider, MD  guaiFENesin (MUCINEX) 600 MG 12 hr tablet Take 1 tablet (600 mg total) by mouth 2 (two) times daily. 01/08/16   Cheri FowlerKayla Asa Baudoin, PA-C  levothyroxine (SYNTHROID, LEVOTHROID) 200 MCG tablet Take 200 mcg by mouth daily before breakfast.     Historical Provider, MD  loratadine (CLARITIN) 10 MG tablet Take 10 mg by mouth daily.    Historical Provider, MD  naproxen (NAPROSYN) 500 MG tablet Take 1 tablet (500 mg total) by mouth 2 (two) times daily. 01/08/16   Cheri FowlerKayla Mazi Schuff, PA-C  oxyCODONE-acetaminophen (PERCOCET/ROXICET) 5-325 MG tablet Take 1 tablet by mouth  every 4 (four) hours as needed for severe pain. 01/08/16   Cheri Fowler, PA-C    Family History History reviewed. No pertinent family history.  Social History Social History  Substance Use Topics  . Smoking status: Current Every Day Smoker    Packs/day: 0.50    Types: Cigarettes  . Smokeless tobacco: Never Used  . Alcohol use No     Allergies   Patient has no known allergies.   Review of Systems Review of Systems  A complete 10 system review of systems was obtained  and all systems are negative except as noted in the HPI and PMH.   Physical Exam Updated Vital Signs BP 113/69 (BP Location: Left Arm)   Pulse 66   Temp 98.6 F (37 C) (Oral)   Resp 18   Ht 6' (1.829 m)   Wt 81.9 kg   SpO2 96%   BMI 24.49 kg/m   Physical Exam  Constitutional: He is oriented to person, place, and time. He appears well-developed and well-nourished. He is active.  Non-toxic appearance. He does not have a sickly appearance. He does not appear ill.  HENT:  Head: Normocephalic and atraumatic.  Right Ear: Tympanic membrane and external ear normal. Tympanic membrane is not erythematous and not bulging.  Left Ear: Tympanic membrane and external ear normal. Tympanic membrane is not erythematous and not bulging.  Nose: Nose normal.  Mouth/Throat: Uvula is midline, oropharynx is clear and moist and mucous membranes are normal. No trismus in the jaw. No uvula swelling. No oropharyngeal exudate, posterior oropharyngeal edema, posterior oropharyngeal erythema or tonsillar abscesses.  Neck: Normal range of motion. Neck supple.  No nuchal rigidity.   Cardiovascular: Normal rate and regular rhythm.   Pulmonary/Chest: Effort normal. No respiratory distress. He has no wheezes. He has rhonchi. He has no rales.  Abdominal: Soft. Bowel sounds are normal. He exhibits no distension. There is no tenderness.  Musculoskeletal: Normal range of motion. He exhibits tenderness.  Tender along left sciatic notch. No lumbar midline tenderness.   Lymphadenopathy:    He has no cervical adenopathy.  Neurological: He is alert and oriented to person, place, and time.  Normal strength and sensation throughout BLE. +SLR on left. No saddle anesthesia.   Skin: Skin is warm and dry.  Psychiatric: He has a normal mood and affect. His behavior is normal.     ED Treatments / Results  DIAGNOSTIC STUDIES: Oxygen Saturation is 96% on RA, adequate by my interpretation.    COORDINATION OF CARE: 2:53 PM  Discussed treatment plan with pt at bedside and pt agreed to plan.    Labs (all labs ordered are listed, but only abnormal results are displayed) Labs Reviewed - No data to display  EKG  EKG Interpretation None       Radiology Dg Chest 2 View  Result Date: 01/08/2016 CLINICAL DATA:  Cough and left-sided chest pain EXAM: CHEST  2 VIEW COMPARISON:  10/27/2014 FINDINGS: The heart size and mediastinal contours are within normal limits. Both lungs are clear. The visualized skeletal structures are unremarkable. IMPRESSION: No active cardiopulmonary disease. Electronically Signed   By: Alcide Clever M.D.   On: 01/08/2016 15:11    Procedures Procedures (including critical care time)  Medications Ordered in ED Medications  dexamethasone (DECADRON) injection 10 mg (10 mg Intramuscular Given 01/08/16 1521)  ketorolac (TORADOL) injection 60 mg (60 mg Intramuscular Given 01/08/16 1521)  cyclobenzaprine (FLEXERIL) tablet 10 mg (10 mg Oral Given 01/08/16 1520)  oxyCODONE-acetaminophen (PERCOCET/ROXICET) 5-325  MG per tablet 1 tablet (1 tablet Oral Given 01/08/16 1521)     Initial Impression / Assessment and Plan / ED Course  I have reviewed the triage vital signs and the nursing notes.  Pertinent labs & imaging results that were available during my care of the patient were reviewed by me and considered in my medical decision making (see chart for details).  Clinical Course     Pt symptoms consistent with URI. CXR negative for acute infiltrate. Patient with known left sciatic back pain.  This appears to be an acute exacerbation.  No focal neurological deficits.  No concern for cauda equina or infection.  Treated with IM Toradol and Decadron and PO Flexeril and Percocet.  No recent prescriptions on [place]CCSRS database, plan to discharge with short course percocet, flexeril, and Naproxen  Mucinex for cough. Discussed return precautions.  Pt is hemodynamically stable & in NAD prior to discharge.   Final  Clinical Impressions(s) / ED Diagnoses   Final diagnoses:  Left-sided low back pain with left-sided sciatica, unspecified chronicity  Cough    New Prescriptions Discharge Medication List as of 01/08/2016  4:14 PM    START taking these medications   Details  guaiFENesin (MUCINEX) 600 MG 12 hr tablet Take 1 tablet (600 mg total) by mouth 2 (two) times daily., Starting Wed 01/08/2016, Print       I personally performed the services described in this documentation, which was scribed in my presence. The recorded information has been reviewed and is accurate.    Cheri Fowler, PA-C 01/08/16 1958    Azalia Bilis, MD 01/09/16 [phone]

## 2016-07-07 ENCOUNTER — Encounter: Payer: Self-pay | Admitting: Podiatry

## 2016-07-07 ENCOUNTER — Ambulatory Visit (INDEPENDENT_AMBULATORY_CARE_PROVIDER_SITE_OTHER): Payer: Medicaid Other | Admitting: Podiatry

## 2016-07-07 VITALS — BP 106/68 | HR 51 | Ht 72.0 in | Wt 174.0 lb

## 2016-07-07 DIAGNOSIS — L603 Nail dystrophy: Secondary | ICD-10-CM

## 2016-07-07 DIAGNOSIS — M79671 Pain in right foot: Secondary | ICD-10-CM

## 2016-07-07 DIAGNOSIS — B351 Tinea unguium: Secondary | ICD-10-CM | POA: Diagnosis not present

## 2016-07-07 DIAGNOSIS — M79672 Pain in left foot: Secondary | ICD-10-CM

## 2016-07-07 DIAGNOSIS — L600 Ingrowing nail: Secondary | ICD-10-CM

## 2016-07-07 DIAGNOSIS — B353 Tinea pedis: Secondary | ICD-10-CM

## 2016-07-07 MED ORDER — TERBINAFINE HCL 250 MG PO TABS: 250.0000 mg | ORAL_TABLET | Freq: Every day | ORAL | 0 refills | Status: DC

## 2016-07-07 NOTE — Patient Instructions (Signed)
Seen for hypertrophic nails, corns, and calluses. All nails, corns, and calluses debrided. Take Medication as prescribed. Soak feet in Vinegar water and dry well. May use cotton balls in between digits while wearing socks. Return in one month for follow up on between the toes.

## 2016-07-07 NOTE — Progress Notes (Signed)
SUBJECTIVE: 51 y.o. year old male presents with painful callus under the 5th toe and heel on right foot duration of many years. Also having problem with toe nails and cracking skin in between toes duration of many years.  REVIEW OF SYSTEMS: Pertinent items noted in HPI and remainder of comprehensive ROS otherwise negative.  OBJECTIVE: DERMATOLOGIC EXAMINATION: Nails:Thick dystrophic nails x 10. Ingrown hallucal nails bilateral. Skin Integrity: Macerated Wuertz discolored inter digital spaces both feet. Plantar calluses bilateral sub 5th MPJ.  VASCULAR EXAMINATION OF LOWER LIMBS: All pedal pulses are palpable with normal pulsation.  Capillary Filling times within 3 seconds in all digits.  No edema or erythema noted. Temperature gradient from tibial crest to dorsum of foot is within normal bilateral.  NEUROLOGIC EXAMINATION OF THE LOWER LIMBS: Achilles DTR is present and within normal. Monofilament (Semmes-Weinstein 10-gm) sensory testing positive 6 out of 6, bilateral. Vibratory sensations(128Hz  turning fork) intact at medial and lateral forefoot bilateral.  Sharp and Dull discriminatory sensations at the plantar ball of hallux is intact bilateral.   MUSCULOSKELETAL EXAMINATION: No gross deformities.  ASSESSMENT: Onychomycosis x 10. Tinea pedis bilateral. Interdigital spaces. Ingrown hallucal nails.  PLAN: All nails, corns, and calluses debrided. Take Medication as prescribed. Soak feet in Vinegar water and dry well. May use cotton balls in between digits while wearing socks. Return in one month for follow up on between the toes.

## 2016-08-11 ENCOUNTER — Encounter: Payer: Self-pay | Admitting: Podiatry

## 2016-08-11 ENCOUNTER — Ambulatory Visit (INDEPENDENT_AMBULATORY_CARE_PROVIDER_SITE_OTHER): Payer: Medicaid Other | Admitting: Podiatry

## 2016-08-11 VITALS — BP 125/80 | HR 65

## 2016-08-11 DIAGNOSIS — M79671 Pain in right foot: Secondary | ICD-10-CM

## 2016-08-11 DIAGNOSIS — M79672 Pain in left foot: Secondary | ICD-10-CM | POA: Diagnosis not present

## 2016-08-11 DIAGNOSIS — L988 Other specified disorders of the skin and subcutaneous tissue: Secondary | ICD-10-CM

## 2016-08-11 DIAGNOSIS — B353 Tinea pedis: Secondary | ICD-10-CM

## 2016-08-11 NOTE — Progress Notes (Signed)
SUBJECTIVE: 51 y.o. year old male presents for follow up on interdigital lesions and plantar calluses. Stated that he has done according to instruction, taking medication, soaking and putting powders in between digits.   REVIEW OF SYSTEMS: No new changes.   OBJECTIVE: DERMATOLOGIC EXAMINATION: Thick dystrophic nails x 10.  Dry skin in web space covered with Fiallos power both feet.  Plantar calluses returned and painful bilateral sub 5th MPJ.  VASCULAR EXAMINATION OF LOWER LIMBS: All pedal pulses are palpable with normal pulsation.  Capillary Filling times within 3 seconds in all digits.  No edema or erythema noted. Temperature gradient from tibial crest to dorsum of foot is within normal bilateral.  NEUROLOGIC EXAMINATION OF THE LOWER LIMBS: All epicritic and tactile sensations grossly intact.  MUSCULOSKELETAL EXAMINATION: No gross deformities.  ASSESSMENT: Tinea pedis bilateral. Interdigital spaces with improvement. Painful plantar calluses sub 5 bilateral.  PLAN: All calluses debrided. Continue with current level of care, soak feet in Vinegar water and dry well and use foot powder.  Return as needed.

## 2016-08-11 NOTE — Patient Instructions (Signed)
Follow up on fungal lesions and painful calluses. Noted of improved interdigital spaces.  Recurring calluses debrided. Continue current level of care in between digits. Return as needed.

## 2017-08-07 ENCOUNTER — Other Ambulatory Visit: Payer: Self-pay

## 2017-08-07 ENCOUNTER — Emergency Department (HOSPITAL_COMMUNITY)
Admission: EM | Admit: 2017-08-07 | Discharge: 2017-08-07 | Disposition: A | Discharge status: Home / Self Care | Payer: Medicaid Other | Attending: Emergency Medicine | Admitting: Emergency Medicine

## 2017-08-07 ENCOUNTER — Emergency Department (HOSPITAL_COMMUNITY): Payer: Medicaid Other

## 2017-08-07 ENCOUNTER — Encounter (HOSPITAL_COMMUNITY): Payer: Self-pay | Admitting: Emergency Medicine

## 2017-08-07 DIAGNOSIS — Z79899 Other long term (current) drug therapy: Secondary | ICD-10-CM | POA: Diagnosis not present

## 2017-08-07 DIAGNOSIS — F17210 Nicotine dependence, cigarettes, uncomplicated: Secondary | ICD-10-CM | POA: Diagnosis not present

## 2017-08-07 DIAGNOSIS — N200 Calculus of kidney: Secondary | ICD-10-CM

## 2017-08-07 DIAGNOSIS — E039 Hypothyroidism, unspecified: Secondary | ICD-10-CM | POA: Insufficient documentation

## 2017-08-07 DIAGNOSIS — N201 Calculus of ureter: Secondary | ICD-10-CM | POA: Insufficient documentation

## 2017-08-07 DIAGNOSIS — R911 Solitary pulmonary nodule: Secondary | ICD-10-CM | POA: Diagnosis present

## 2017-08-07 DIAGNOSIS — R1031 Right lower quadrant pain: Secondary | ICD-10-CM | POA: Diagnosis present

## 2017-08-07 LAB — COMPREHENSIVE METABOLIC PANEL
ALBUMIN: 4.4 g/dL (ref 3.5–5.0)
ALT: 26 U/L (ref 0–44)
AST: 70 U/L — ABNORMAL HIGH (ref 15–41)
Alkaline Phosphatase: 32 U/L — ABNORMAL LOW (ref 38–126)
Anion gap: 8 (ref 5–15)
BUN: 10 mg/dL (ref 6–20)
CHLORIDE: 105 mmol/L (ref 98–111)
CO2: 25 mmol/L (ref 22–32)
CREATININE: 1.24 mg/dL (ref 0.61–1.24)
Calcium: 9.9 mg/dL (ref 8.9–10.3)
GFR calc Af Amer: >60 mL/min (ref >60)
GLUCOSE: 130 mg/dL — AB (ref 70–99)
POTASSIUM: 3.4 mmol/L — AB (ref 3.5–5.1)
Sodium: 138 mmol/L (ref 135–145)
Total Bilirubin: 0.9 mg/dL (ref 0.3–1.2)
Total Protein: 7 g/dL (ref 6.5–8.1)

## 2017-08-07 LAB — URINALYSIS, ROUTINE W REFLEX MICROSCOPIC
BACTERIA UA: NONE SEEN
Bilirubin Urine: NEGATIVE
Glucose, UA: NEGATIVE mg/dL
Hgb urine dipstick: NEGATIVE
KETONES UR: NEGATIVE mg/dL
LEUKOCYTES UA: NEGATIVE
Nitrite: NEGATIVE
Protein, ur: NEGATIVE mg/dL
pH: 5 (ref 5.0–8.0)

## 2017-08-07 LAB — CBC
HEMATOCRIT: 38.5 % — AB (ref 39.0–52.0)
Hemoglobin: 12.7 g/dL — ABNORMAL LOW (ref 13.0–17.0)
MCH: 31 pg (ref 26.0–34.0)
MCHC: 33 g/dL (ref 30.0–36.0)
MCV: 93.9 fL (ref 78.0–100.0)
PLATELETS: 266 10*3/uL (ref 150–400)
RBC: 4.1 MIL/uL — ABNORMAL LOW (ref 4.22–5.81)
RDW: 15.9 % — AB (ref 11.5–15.5)
WBC: 4.5 10*3/uL (ref 4.0–10.5)

## 2017-08-07 LAB — I-STAT CG4 LACTIC ACID, ED: Lactic Acid, Venous: 1.21 mmol/L (ref 0.5–1.9)

## 2017-08-07 LAB — LIPASE, BLOOD: LIPASE: 42 U/L (ref 11–51)

## 2017-08-07 MED ORDER — IOPAMIDOL (ISOVUE-300) INJECTION 61%: 100.0000 mL | Freq: Once | INTRAVENOUS | Status: DC | PRN

## 2017-08-07 MED ORDER — MORPHINE SULFATE (PF) 4 MG/ML IV SOLN
4.0000 mg | Freq: Once | INTRAVENOUS | Status: AC
  Administered 2017-08-07: 4 mg via INTRAVENOUS
  Filled 2017-08-07: qty 1

## 2017-08-07 MED ORDER — IOHEXOL 300 MG/ML  SOLN
100.0000 mL | Freq: Once | INTRAMUSCULAR | Status: AC | PRN
  Administered 2017-08-07: 100 mL via INTRAVENOUS

## 2017-08-07 MED ORDER — SODIUM CHLORIDE 0.9 % IV BOLUS
1000.0000 mL | Freq: Once | INTRAVENOUS | Status: AC
  Administered 2017-08-07: 1000 mL via INTRAVENOUS

## 2017-08-07 NOTE — ED Triage Notes (Signed)
Pt to ER for evaluation of right inguinal hernia, pt reports has not been able to retract it since yesterday. Pt reports no bowel movement for "a couple of weeks." pt denies nausea, vomiting.

## 2017-08-07 NOTE — ED Notes (Signed)
Pt transported to CT ?

## 2017-08-07 NOTE — ED Notes (Signed)
Patient made aware of need for urine sample.  States he cannot provide at this time

## 2017-08-07 NOTE — ED Notes (Signed)
Called lab about adding lipase

## 2017-08-07 NOTE — ED Notes (Signed)
Hooked patient to the monitor patient is resting with call bell in reach and family at bedside 

## 2017-08-07 NOTE — Discharge Instructions (Addendum)
Please follow up with your primary care provider.    If you have worsening symptoms, your symptoms happen again, or you have any concerns please seek additional medical care and evaluation.  You did have pulmonary nodules found on your CT scan.. Please follow-up with your primary care about this.   Small 2-3 mm right middle lobe pulmonary nodules. No follow-up  needed if patient is low-risk (and has no known or suspected primary  neoplasm). Non-contrast chest CT can be considered in 12 months if  patient is high-risk. This recommendation follows the consensus  statement: Guidelines for Management of Incidental Pulmonary Nodules  Detected on CT Images:From the Fleischner Society 2017; published  online before print (10.1148/[id number]).

## 2017-08-07 NOTE — ED Provider Notes (Signed)
[facility] EM[facility]GENC[facility] Provider Note   CSN: [id number] Arrival date & time: 08/07/17  1052     History   Chief Complaint Chief Complaint  Patient presents with  . Hernia    HPI George Floyd is a 52 y.o. male with a past medical history of hypothyroid, bradycardia, who presents today for evaluation of right-sided groin pain.  He reports that for the past few weeks he has had intermittent pain in his right sided groin that comes and goes.  Last night when he was showering he noticed that he had a swollen, painful area in his right sided groin.  He reports that he has had significant decrease in bowel movement over the past few weeks stating that he only has small amounts of output and that he has to strain significantly for that output.  He denies any nausea or vomiting.  No dysuria, hematuria.  No fevers or chills.  HPI  Past Medical History:  Diagnosis Date  . Back pain   . 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 Thyroid disease     Patient Active Problem List   Diagnosis Date Noted  . Pulmonary nodule 08/07/2017  . Hypothyroidism 10/26/2012  . Syncope- neurally mediated 10/25/2012  . Sinus bradycardia 10/25/2012  . Anxiety 10/25/2012  . 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 HEMORRHOIDS, EXTERNAL 10/25/2006  . SYMPTOM, ABNORMAL LOSS OF WEIGHT 10/25/2006    Past Surgical History:  Procedure Laterality Date  . CYSTOSCOPY/RETROGRADE/URETEROSCOPY/STONE EXTRACTION WITH BASKET Right 04/15/2012   Procedure: CYSTOSCOPY/RETROGRADE/URETEROSCOPY/STONE EXTRACTION WITH BASKET;  Surgeon: Marcine MatarStephen Dahlstedt, MD;  Location: [facility];  Service: Urology;  Laterality: Right;  . HOLMIUM LASER APPLICATION Right 04/15/2012   Procedure: HOLMIUM LASER APPLICATION;  Surgeon: Marcine MatarStephen Dahlstedt, MD;  Location: [facility];  Service: Urology;  Laterality: Right;        Home Medications    Prior to Admission medications   Medication Sig Start Date End Date Taking?  Authorizing Provider  Carboxymethylcellul-Glycerin (CLEAR EYES FOR DRY EYES) 1-0.25 % SOLN Apply 1 drop to eye daily as needed (dry eyes).   Yes [provider]  cyclobenzaprine (FLEXERIL) 10 MG tablet Take 1 tablet (10 mg total) by mouth 2 (two) times daily as needed for muscle spasms. Patient not taking: Reported on 08/07/2017 01/08/16   Cheri Fowlerose, Kayla, PA-C  guaiFENesin (MUCINEX) 600 MG 12 hr tablet Take 1 tablet (600 mg total) by mouth 2 (two) times daily. Patient not taking: Reported on 08/07/2017 01/08/16   Cheri Fowlerose, Kayla, PA-C  naproxen (NAPROSYN) 500 MG tablet Take 1 tablet (500 mg total) by mouth 2 (two) times daily. Patient not taking: Reported on 08/07/2017 01/08/16   Cheri Fowlerose, Kayla, PA-C  oxyCODONE-acetaminophen (PERCOCET/ROXICET) 5-325 MG tablet Take 1 tablet by mouth every 4 (four) hours as needed for severe pain. Patient not taking: Reported on 08/07/2017 01/08/16   Cheri Fowlerose, Kayla, PA-C  tiZANidine (ZANAFLEX) 4 MG tablet Take 4 mg by mouth once.    [provider]    Family History History reviewed. No pertinent family history.  Social History Social History   Tobacco Use  . Smoking status: Current Every Day Smoker    Packs/day: 0.50    Types: Cigarettes  . Smokeless tobacco: Never Used  Substance Use Topics  . Alcohol use: No  . Drug use: Yes    Types: Marijuana    Comment: 1 month ago     Allergies   Patient has no known allergies.  Review of Systems Review of Systems  Gastrointestinal: Positive for constipation. Negative for abdominal distention, abdominal pain, diarrhea and nausea.  Genitourinary: Negative for difficulty urinating, discharge, dysuria, hematuria, penile pain, penile swelling, scrotal swelling, testicular pain and urgency.       Right groin pain  Musculoskeletal: Negative for back pain.  All other systems reviewed and are negative.    Physical Exam Updated Vital Signs BP (!) 120/97   Pulse (!) 46   Temp 97.7 F (36.5 C) (Oral)   Resp 18    Ht 6' (1.829 m)   Wt 99.8 kg (220 lb)   SpO2 99%   BMI 29.84 kg/m   Physical Exam  Constitutional: He appears well-developed and well-nourished. No distress.  HENT:  Head: Normocephalic and atraumatic.  Mouth/Throat: Oropharynx is clear and moist.  Eyes: Conjunctivae are normal. Right eye exhibits no discharge. Left eye exhibits no discharge. No scleral icterus.  Neck: Normal range of motion. Neck supple.  Cardiovascular: Normal rate, regular rhythm and normal heart sounds. Exam reveals no friction rub.  No murmur heard. Pulmonary/Chest: Effort normal and breath sounds normal. No stridor. No respiratory distress.  Abdominal: Soft. Bowel sounds are normal. He exhibits no distension and no mass. There is no tenderness. There is no rebound, no guarding and no CVA tenderness. No hernia.  There is pain in the right inguinal area.  There is not obviously a mass or swelling.  No abnormal erythema, drainage or lesions.  Patient has pain with palpation of this area and tears fall down his face when this area is palpated.  Genitourinary:  Genitourinary Comments: Refused by patient.  Musculoskeletal: He exhibits no edema or deformity.  Neurological: He is alert. He exhibits normal muscle tone.  Skin: Skin is warm and dry. He is not diaphoretic.  Psychiatric: He has a normal mood and affect. His behavior is normal.  Nursing note and vitals reviewed.    ED Treatments / Results  Labs (all labs ordered are listed, but only abnormal results are displayed) Labs Reviewed  COMPREHENSIVE METABOLIC PANEL - Abnormal; Notable for the following components:      Result Value   Potassium 3.4 (*)    Glucose, Bld 130 (*)    AST 70 (*)    Alkaline Phosphatase 32 (*)    All other components within normal limits  CBC - Abnormal; Notable for the following components:   RBC 4.10 (*)    Hemoglobin 12.7 (*)    HCT 38.5 (*)    RDW 15.9 (*)    All other components within normal limits  URINALYSIS, ROUTINE W  REFLEX MICROSCOPIC - Abnormal; Notable for the following components:   Specific Gravity, Urine >1.046 (*)    All other components within normal limits  LIPASE, BLOOD  I-STAT CG4 LACTIC ACID, ED    EKG None  Radiology Ct Abdomen Pelvis W Contrast  Result Date: 08/07/2017 CLINICAL DATA:  52 year old male with a suspected femoral hernia. EXAM: CT ABDOMEN AND PELVIS WITH CONTRAST TECHNIQUE: Multidetector CT imaging of the abdomen and pelvis was performed using the standard protocol following bolus administration of intravenous contrast. CONTRAST:  OMNIPAQUE IOHEXOL 300 MG/ML  SOLN COMPARISON:  Prior CT abdomen/pelvis 04/12/2012 FINDINGS: Lower chest: Tiny 2-3 mm nodules in the inferior right middle lobe are noted incidentally. Trace dependent atelectasis. Small pulmonary cyst in the periphery of the left lower lobe, unchanged. No focal airspace consolidation. Visualized cardiac structures are normal in size. No pericardial effusion. Hepatobiliary: Normal hepatic contour  and morphology. No discrete hepatic lesions. Normal appearance of the gallbladder. No intra or extrahepatic biliary ductal dilatation. Pancreas: Unremarkable. No pancreatic ductal dilatation or surrounding inflammatory changes. Spleen: Normal in size without focal abnormality. Adrenals/Urinary Tract: No evidence of nephrolithiasis or hydronephrosis. No enhancing renal mass. Extrarenal pelvis on the left, similar compared to prior imaging. A stone is present in the region of the right ureterovesicular junction. Given the relative absence of hydronephrosis, this may represent a recently passed stone. The bladder is otherwise unremarkable. Stomach/Bowel: No evidence of obstruction or focal bowel wall thickening. Normal appendix in the right lower quadrant. The terminal ileum is unremarkable. Vascular/Lymphatic: No significant vascular findings are present. No enlarged abdominal or pelvic lymph nodes. Reproductive: Prostate is unremarkable.  Other: No abdominal wall hernia or abnormality. No abdominopelvic ascites. Musculoskeletal: No acute fracture or aggressive appearing lytic or blastic osseous lesion. IMPRESSION: 1. Punctate stone in the bladder adjacent to the right ureterovesicular junction. Given the absence of associated right hydro nephrosis, this likely represents a recently passed stone. 2. Small 2-3 mm right middle lobe pulmonary nodules. No follow-up needed if patient is low-risk (and has no known or suspected primary neoplasm). Non-contrast chest CT can be considered in 12 months if patient is high-risk. This recommendation follows the consensus statement: Guidelines for Management of Incidental Pulmonary Nodules Detected on CT Images:From the Fleischner Society 2017; published online before print (10.1148/radiol.[id number]). Electronically Signed   By: Malachy Moan M.D.   On: 08/07/2017 14:43    Procedures Procedures (including critical care time)  Medications Ordered in ED Medications  sodium chloride 0.9 % bolus 1,000 mL (0 mLs Intravenous Stopped 08/07/17 1533)  morphine 4 MG/ML injection 4 mg (4 mg Intravenous Given 08/07/17 1257)  iohexol (OMNIPAQUE) 300 MG/ML solution 100 mL (100 mLs Intravenous Contrast Given 08/07/17 1400)     Initial Impression / Assessment and Plan / ED Course  I have reviewed the triage vital signs and the nursing notes.  Pertinent labs & imaging results that were available during my care of the patient were reviewed by me and considered in my medical decision making (see chart for details).    Patient presents today for evaluation of pain in his right groin.  He was concerned that this area may be a hernia, stating that he felt a knot there.  On my exam there is no obvious abnormal swelling, erythema, or induration.  He refused penile and testicular exam.  Labs were obtained and reviewed, hemoglobin is slightly low at 12.7, limited recent labs for comparison, however he had labs in 2014,  second most recent set of labs, that appear consistent with this.  AST was mildly elevated, consistent with previous labs.  His lactic acid is not elevated.  He was bradycardic mostly while in the department, he has a history of this and review of vitals show that he normally runs in the 40s to 50s heart rate wise.  Was advised that he needs to follow-up with his primary care provider for thyroid evaluation.  CT abdomen pelvis was obtained showing that he appears to have a very small stone near the right-sided UVJ, given the patient's pain improved while in the emergency room, and lack of hydronephrosis suspect that the stone has passed into the bladder.  Patient does have a history of kidney stones.  His CT did not show any evidence of a hernia.  Return precautions were discussed with patient who states his understanding.  He was advised to follow-up with primary  care doctor.  His CT abdomen did show lung nodules, he was informed of these and given written instructions on when to follow-up and that he needs to follow this up with his primary care doctor.  At the time of discharge patient was given the option to ask questions, all of which were answered the best of my abilities.  Denied any unaddressed concerns or complaints today and requested discharge home.  Patient was discharged.   Final Clinical Impressions(s) / ED Diagnoses   Final diagnoses:  Kidney stone  Pulmonary nodule    ED Discharge Orders    None       Norman Clay 08/07/17 2153    Arby Barrette, MD 08/09/17 [phone]

## 2017-08-17 ENCOUNTER — Emergency Department (HOSPITAL_COMMUNITY): Payer: Medicaid Other

## 2017-08-17 ENCOUNTER — Encounter (HOSPITAL_COMMUNITY): Payer: Self-pay | Admitting: *Deleted

## 2017-08-17 ENCOUNTER — Emergency Department (HOSPITAL_COMMUNITY)
Admission: EM | Admit: 2017-08-17 | Discharge: 2017-08-17 | Disposition: A | Discharge status: Home / Self Care | Payer: Medicaid Other | Attending: Emergency Medicine | Admitting: Emergency Medicine

## 2017-08-17 DIAGNOSIS — E039 Hypothyroidism, unspecified: Secondary | ICD-10-CM | POA: Diagnosis not present

## 2017-08-17 DIAGNOSIS — F1210 Cannabis abuse, uncomplicated: Secondary | ICD-10-CM | POA: Diagnosis not present

## 2017-08-17 DIAGNOSIS — F17210 Nicotine dependence, cigarettes, uncomplicated: Secondary | ICD-10-CM | POA: Diagnosis not present

## 2017-08-17 DIAGNOSIS — N23 Unspecified renal colic: Secondary | ICD-10-CM | POA: Insufficient documentation

## 2017-08-17 DIAGNOSIS — R1030 Lower abdominal pain, unspecified: Secondary | ICD-10-CM | POA: Insufficient documentation

## 2017-08-17 DIAGNOSIS — Z8673 Personal history of transient ischemic attack (TIA), and cerebral infarction without residual deficits: Secondary | ICD-10-CM | POA: Diagnosis not present

## 2017-08-17 DIAGNOSIS — R319 Hematuria, unspecified: Secondary | ICD-10-CM | POA: Diagnosis present

## 2017-08-17 LAB — BASIC METABOLIC PANEL
ANION GAP: 8 (ref 5–15)
BUN: 9 mg/dL (ref 6–20)
CALCIUM: 9 mg/dL (ref 8.9–10.3)
CO2: 26 mmol/L (ref 22–32)
Chloride: 103 mmol/L (ref 98–111)
Creatinine, Ser: 1.61 mg/dL — ABNORMAL HIGH (ref 0.61–1.24)
GFR calc Af Amer: 54 mL/min — ABNORMAL LOW (ref >60)
GFR calc non Af Amer: 47 mL/min — ABNORMAL LOW (ref >60)
GLUCOSE: 118 mg/dL — AB (ref 70–99)
POTASSIUM: 3.5 mmol/L (ref 3.5–5.1)
Sodium: 137 mmol/L (ref 135–145)

## 2017-08-17 LAB — CBC WITH DIFFERENTIAL/PLATELET
ABS IMMATURE GRANULOCYTES: 0 10*3/uL (ref 0.0–0.1)
Basophils Absolute: 0.1 10*3/uL (ref 0.0–0.1)
Basophils Relative: 2 %
EOS PCT: 1 %
Eosinophils Absolute: 0.1 10*3/uL (ref 0.0–0.7)
HEMATOCRIT: 37.3 % — AB (ref 39.0–52.0)
HEMOGLOBIN: 12.4 g/dL — AB (ref 13.0–17.0)
Immature Granulocytes: 0 %
LYMPHS ABS: 1.8 10*3/uL (ref 0.7–4.0)
LYMPHS PCT: 51 %
MCH: 30.9 pg (ref 26.0–34.0)
MCHC: 33.2 g/dL (ref 30.0–36.0)
MCV: 93 fL (ref 78.0–100.0)
MONO ABS: 0.3 10*3/uL (ref 0.1–1.0)
Monocytes Relative: 9 %
NEUTROS ABS: 1.3 10*3/uL — AB (ref 1.7–7.7)
Neutrophils Relative %: 37 %
Platelets: 255 10*3/uL (ref 150–400)
RBC: 4.01 MIL/uL — ABNORMAL LOW (ref 4.22–5.81)
RDW: 15.8 % — ABNORMAL HIGH (ref 11.5–15.5)
WBC: 3.6 10*3/uL — ABNORMAL LOW (ref 4.0–10.5)

## 2017-08-17 LAB — URINALYSIS, ROUTINE W REFLEX MICROSCOPIC
Bilirubin Urine: NEGATIVE
GLUCOSE, UA: NEGATIVE mg/dL
KETONES UR: 5 mg/dL — AB
LEUKOCYTES UA: NEGATIVE
NITRITE: NEGATIVE
PH: 6 (ref 5.0–8.0)
Protein, ur: 100 mg/dL — AB
Renal Epithelial: 6
Specific Gravity, Urine: 1.026 (ref 1.005–1.030)

## 2017-08-17 MED ORDER — ONDANSETRON 8 MG PO TBDP: 8.0000 mg | ORAL_TABLET | Freq: Three times a day (TID) | ORAL | 0 refills | Status: AC | PRN

## 2017-08-17 MED ORDER — HYDROCODONE-ACETAMINOPHEN 5-325 MG PO TABS: 1.0000 | ORAL_TABLET | Freq: Three times a day (TID) | ORAL | 0 refills | Status: AC | PRN

## 2017-08-17 MED ORDER — KETOROLAC TROMETHAMINE 15 MG/ML IJ SOLN: 10.0000 mg | Freq: Once | INTRAMUSCULAR | Status: DC

## 2017-08-17 MED ORDER — IBUPROFEN 400 MG PO TABS: 400.0000 mg | ORAL_TABLET | Freq: Four times a day (QID) | ORAL | 0 refills | Status: AC | PRN

## 2017-08-17 MED ORDER — KETOROLAC TROMETHAMINE 15 MG/ML IJ SOLN
30.0000 mg | Freq: Once | INTRAMUSCULAR | Status: AC
  Administered 2017-08-17: 30 mg via INTRAVENOUS
  Filled 2017-08-17: qty 2

## 2017-08-17 MED ORDER — OXYCODONE-ACETAMINOPHEN 5-325 MG PO TABS
1.0000 | ORAL_TABLET | Freq: Once | ORAL | Status: AC
  Administered 2017-08-17: 1 via ORAL
  Filled 2017-08-17: qty 1

## 2017-08-17 NOTE — ED Notes (Signed)
ED Provider at bedside. 

## 2017-08-17 NOTE — ED Notes (Signed)
Patient verbalizes understanding of discharge instructions. Opportunity for questioning and answers were provided. Armband removed by staff, pt discharged from ED ambulatory.   

## 2017-08-17 NOTE — ED Triage Notes (Addendum)
Pt in reports L hip pain  And groin pain with hematuria, pt reports oliguria, pt denies fever and chilss, pt reports onset today, pt denies n/v/d, seen here 8/3 and diagnosed with a kidney stone, denies passing stone since that time

## 2017-08-17 NOTE — Discharge Instructions (Addendum)
He was seen in the ER for bloody urine.  Our evaluation here shows that you have a stone in your bladder.  I spoke with Dr. Berneice HeinrichManny, urologist. He recommends that you call his clinic today so that you can be seen sometime this week.  You might need surgery for removal of the stone.

## 2017-08-20 NOTE — ED Provider Notes (Signed)
[facility] EM[facility]ENCY [facility]PARTMENT Provider Note   CSN: [id number] Arrival date & time: 08/17/17  [id number]     History   Chief Complaint Chief Complaint  Patient presents with  . Hematuria    HPI George Floyd is a 52 y.o. male.  HPI 52 year old male comes in with chief complaint of bloody urine.  Patient has history of stroke and was recently diagnosed with a bladder stone.  Patient reports that he continues to have lower quadrant abdominal pain, and today he went to urinate and noted blood.  Patient denies any pain with urination. Patient denies any fevers or chills.  Past Medical History:  Diagnosis Date  . Back pain   . 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 Thyroid disease     Patient Active Problem List   Diagnosis Date Noted  . Pulmonary nodule 08/07/2017  . Hypothyroidism 10/26/2012  . Syncope- neurally mediated 10/25/2012  . Sinus bradycardia 10/25/2012  . Anxiety 10/25/2012  . 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 HEMORRHOIDS, EXTERNAL 10/25/2006  . SYMPTOM, ABNORMAL LOSS OF WEIGHT 10/25/2006    Past Surgical History:  Procedure Laterality Date  . CYSTOSCOPY/RETROGRADE/URETEROSCOPY/STONE EXTRACTION WITH BASKET Right 04/15/2012   Procedure: CYSTOSCOPY/RETROGRADE/URETEROSCOPY/STONE EXTRACTION WITH BASKET;  Surgeon: Marcine MatarStephen Dahlstedt, MD;  Location: [facility];  Service: Urology;  Laterality: Right;  . HOLMIUM LASER APPLICATION Right 04/15/2012   Procedure: HOLMIUM LASER APPLICATION;  Surgeon: Marcine MatarStephen Dahlstedt, MD;  Location: [facility];  Service: Urology;  Laterality: Right;        Home Medications    Prior to Admission medications   Medication Sig Start Date End Date Taking? Authorizing Provider  Carboxymethylcellul-Glycerin (CLEAR EYES FOR DRY EYES) 1-0.25 % SOLN Apply 1 drop to eye daily as needed (dry eyes).   Yes [provider]  LEVOTHYROXINE SODIUM PO Take by mouth daily before breakfast.   Yes [provider]    tiZANidine (ZANAFLEX) 4 MG tablet Take 4 mg by mouth once.   Yes [provider]  HYDROcodone-acetaminophen (NORCO/VICODIN) 5-325 MG tablet Take 1 tablet by mouth every 8 (eight) hours as needed for up to 3 days for severe pain. 08/17/17 08/20/17  Derwood KaplanNanavati, Tahira Olivarez, MD  ibuprofen (ADVIL,MOTRIN) 400 MG tablet Take 1 tablet (400 mg total) by mouth every 6 (six) hours as needed. 08/17/17   Derwood KaplanNanavati, Delan Ksiazek, MD  ondansetron (ZOFRAN ODT) 8 MG disintegrating tablet Take 1 tablet (8 mg total) by mouth every 8 (eight) hours as needed for nausea. 08/17/17   Derwood KaplanNanavati, Infant Doane, MD    Family History No family history on file.  Social History Social History   Tobacco Use  . Smoking status: Current Every Day Smoker    Packs/day: 0.50    Types: Cigarettes  . Smokeless tobacco: Never Used  Substance Use Topics  . Alcohol use: No  . Drug use: Yes    Types: Marijuana    Comment: 1 month ago     Allergies   Patient has no known allergies.   Review of Systems Review of Systems  Constitutional: Positive for activity change.  Respiratory: Negative for shortness of breath.   Cardiovascular: Negative for chest pain.  Genitourinary: Positive for flank pain and hematuria.  All other systems reviewed and are negative.    Physical Exam Updated Vital Signs BP 123/85   Pulse (!) 59   Temp 97.6 F (36.4 C) (Oral)   Resp 16   SpO2 100%   Physical Exam  Constitutional: He is  oriented to person, place, and time. He appears well-developed.  HENT:  Head: Atraumatic.  Neck: Neck supple.  Cardiovascular: Normal rate.  Pulmonary/Chest: Effort normal.  Abdominal: Soft. No hernia.  Suprapubic tenderness and flank tenderness on the right side  Neurological: He is alert and oriented to person, place, and time.  Skin: Skin is warm.  Nursing note and vitals reviewed.    ED Treatments / Results  Labs (all labs ordered are listed, but only abnormal results are displayed) Labs Reviewed   URINALYSIS, ROUTINE W REFLEX MICROSCOPIC - Abnormal; Notable for the following components:      Result Value   Color, Urine AMBER (*)    APPearance HAZY (*)    Hgb urine dipstick LARGE (*)    Ketones, ur 5 (*)    Protein, ur 100 (*)    RBC / HPF >50 (*)    Bacteria, UA RARE (*)    All other components within normal limits  BASIC METABOLIC PANEL - Abnormal; Notable for the following components:   Glucose, Bld 118 (*)    Creatinine, Ser 1.61 (*)    GFR calc non Af Amer 47 (*)    GFR calc Af Amer 54 (*)    All other components within normal limits  CBC WITH DIFFERENTIAL/PLATELET - Abnormal; Notable for the following components:   WBC 3.6 (*)    RBC 4.01 (*)    Hemoglobin 12.4 (*)    HCT 37.3 (*)    RDW 15.8 (*)    Neutro Abs 1.3 (*)    All other components within normal limits    EKG None  Radiology No results found.  Procedures Procedures (including critical care time)  Medications Ordered in ED Medications  oxyCODONE-acetaminophen (PERCOCET/ROXICET) 5-325 MG per tablet 1 tablet (1 tablet Oral Given 08/17/17 0954)  ketorolac (TORADOL) 15 MG/ML injection 30 mg (30 mg Intravenous Given 08/17/17 1248)     Initial Impression / Assessment and Plan / ED Course  I have reviewed the triage vital signs and the nursing notes.  Pertinent labs & imaging results that were available during my care of the patient were reviewed by me and considered in my medical decision making (see chart for details).     52 year old male comes in with chief complaint of bloody urine.  Patient had gross hematuria and he has persistent flank pain and lower quadrant abdominal pain.  Patient had a CT scan about 10 days ago that showed a punctate lesion at the UVJ. Ultrasound renal ordered, and it seems like patient has possibly larger stone.  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  Given that patient is still symptomatic and potentially getting worse, I spoke with urology.  Urology team requested that patient  be asked to come to the clinic within the next 2 to 3 days.  The suspect that patient might need intervention given the stone is not passing.  Final Clinical Impressions(s) / ED Diagnoses   Final diagnoses:  Ureteral colic    ED Discharge Orders         Ordered    HYDROcodone-acetaminophen (NORCO/VICODIN) 5-325 MG tablet  Every 8 hours PRN     08/17/17 1424    ibuprofen (ADVIL,MOTRIN) 400 MG tablet  Every 6 hours PRN     08/17/17 1424    ondansetron (ZOFRAN ODT) 8 MG disintegrating tablet  Every 8 hours PRN     08/17/17 1424           Derwood KaplanNanavati, Tyisha Cressy, MD 08/20/17 1619

## 2017-11-29 ENCOUNTER — Ambulatory Visit: Payer: Self-pay | Admitting: Surgery

## 2017-11-29 NOTE — H&P (Signed)
George Floyd Documented: 11/29/2017 1:35 PM Location: [facility] Patient #: [id number] DOB: Jun 22, 1965 Single / Language: [place] / Race: Black or African American Male  History of Present Illness George Floyd(George Floyd A. Mark Hassey MD; 11/29/2017 3:05 PM) Patient words: Patient sent at the request of George Floyd for swelling in his right groin. This has been present for an unknown period of time according to the patient. Over the last month, he has had more right groin pain and swelling especially with coughing, standing or pushing. He denies nausea or vomiting or change in bowel or bladder function.  The patient is a 52 year old male.   Past Surgical History George Floyd(George Floyd, [name]; 11/29/2017 1:35 PM) No pertinent past surgical history  Diagnostic Studies History George Floyd(George Floyd, [name]; 11/29/2017 1:35 PM) Colonoscopy never  Allergies George Floyd(George Floyd, [name]; 11/29/2017 1:35 PM) No Known Drug Allergies [11/29/2017]:  Medication History George Floyd(George Floyd, [name]; 11/29/2017 1:36 PM) Levothyroxine Sodium (Oral) Specific strength unknown - Active. Loratadine (Oral) Specific strength unknown - Active. Medications Reconciled  Social History George Floyd(George Floyd, [name]; 11/29/2017 1:35 PM) Alcohol use Remotely quit alcohol use. Caffeine use Carbonated beverages, Coffee, Tea. No drug use Tobacco use Current every day smoker.  Family History George Floyd(George Floyd, [name]; 11/29/2017 1:35 PM) First Degree Relatives No pertinent family history  Other Problems George Floyd(George Floyd, [name]; 11/29/2017 1:35 PM) Cerebrovascular Accident Thyroid Disease Ventral Hernia Repair     Review of Systems (George Regnier A. Albertha Beattie MD; 11/29/2017 3:06 PM) General Present- Appetite Loss, Fatigue and Weight Loss. Not Present- Chills, Fever, Night Sweats and Weight Gain. Skin Present- Non-Healing Wounds. Not Present- Change in Wart/Mole, Dryness, Hives, Jaundice, New Lesions, Rash and Ulcer. HEENT  Present- Sinus Pain and Wears glasses/contact lenses. Not Present- Earache, Hearing Loss, Hoarseness, Nose Bleed, Oral Ulcers, Ringing in the Ears, Seasonal Allergies, Sore Throat, Visual Disturbances and Yellow Eyes. Respiratory Present- Wheezing. Not Present- Bloody sputum, Chronic Cough, Difficulty Breathing and Snoring. Breast Not Present- Breast Mass, Breast Pain, Nipple Discharge and Skin Changes. Cardiovascular Not Present- Chest Pain, Difficulty Breathing Lying Down, Leg Cramps, Palpitations, Rapid Heart Rate, Shortness of Breath and Swelling of Extremities. Gastrointestinal Present- Abdominal Pain. Not Present- Bloating, Bloody Stool, Change in Bowel Habits, Chronic diarrhea, Constipation, Difficulty Swallowing, Excessive gas, Gets full quickly at meals, Hemorrhoids, Indigestion, Nausea, Rectal Pain and Vomiting. All other systems negative  Vitals George Floyd(George Floyd [name]; 11/29/2017 1:35 PM) 11/29/2017 1:35 PM Weight: 173.38 lb Height: 73in Body Surface Area: 2.03 m Body Mass Index: 22.87 kg/m  Pulse: 67 (Regular)  BP: 108/68 (Sitting, Left Arm, Standard)      Physical Exam (George Atilano A. Valeriano Bain MD; 11/29/2017 3:05 PM)  General Mental Status-Alert. General Appearance-Consistent with stated age. Hydration-Well hydrated. Voice-Normal.  Head and Neck Head-normocephalic, atraumatic with no lesions or palpable masses. Trachea-midline. Thyroid Gland Characteristics - normal size and consistency.  Eye Eyeball - Bilateral-Extraocular movements intact. Sclera/Conjunctiva - Bilateral-No scleral icterus.  Chest and Lung Exam Chest and lung exam reveals -quiet, even and easy respiratory effort with no use of accessory muscles and on auscultation, normal breath sounds, no adventitious sounds and normal vocal resonance. Inspection Chest Wall - Normal. Back - normal.  Cardiovascular Cardiovascular examination reveals -normal heart sounds, regular rate  and rhythm with no murmurs and normal pedal pulses bilaterally.  Abdomen Note: Reducible right inguinal hernia. No evidence of left inguinal hernia.  Male Genitourinary Note: Normal  Neurologic Neurologic evaluation reveals -alert and oriented x 3 with no impairment of  recent or remote memory. Mental Status-Normal.  Musculoskeletal Normal Exam - Left-Upper Extremity Strength Normal and Lower Extremity Strength Normal. Normal Exam - Right-Upper Extremity Strength Normal and Lower Extremity Strength Normal.    Assessment & Plan (George Tauer A. Jacyln Carmer MD; 11/29/2017 3:05 PM)  RIGHT INGUINAL HERNIA (K40.90) Impression: Discussed open repair with mesh. Laparoscopic techniques discussed risk and benefits discussed. He has opted for repair of right inguinal hernia with mesh. The risk of hernia repair include bleeding, infection, organ injury, bowel injury, bladder injury, nerve injury recurrent hernia, blood clots, worsening of underlying condition, chronic pain, mesh use, open surgery, death, and the need for other operattions. Pt agrees to proceed  Current Plans You are being scheduled for surgery- Our schedulers will call you.  You should hear from our office's scheduling department within 5 working days about the location, date, and time of surgery. We try to make accommodations for patient's preferences in scheduling surgery, but sometimes the OR schedule or the surgeon's schedule prevents [place] from making those accommodations.  If you have not heard from our office [phone]) in 5 working days, call the office and ask for your surgeon's nurse.  If you have other questions about your diagnosis, plan, or surgery, call the office and ask for your surgeon's nurse.  Pt Education - Pamphlet Given - Hernia Surgery: discussed with patient and provided information. The anatomy & physiology of the abdominal wall and pelvic floor was discussed. The pathophysiology of hernias in the  inguinal and pelvic region was discussed. Natural history risks such as progressive enlargement, pain, incarceration, and strangulation was discussed. Contributors to complications such as smoking, obesity, diabetes, prior surgery, etc were discussed.  I feel the risks of no intervention will lead to serious problems that outweigh the operative risks; therefore, I recommended surgery to reduce and repair the hernia. I explained an open approach. I noted usual use of mesh to patch and/or buttress hernia repair  Risks such as bleeding, infection, abscess, need for further treatment, heart attack, death, and other risks were discussed. I noted a good likelihood this will help address the problem. Goals of post-operative recovery were discussed as well. Possibility that this will not correct all symptoms was explained. I stressed the importance of low-impact activity, aggressive pain control, avoiding constipation, & not pushing through pain to minimize risk of post-operative chronic pain or injury. Possibility of reherniation was discussed. We will work to minimize complications.  An educational handout further explaining the pathology & treatment options was given as well. Questions were answered. The patient expresses understanding & wishes to proceed with surgery.  Started Naprosyn 250 MG Oral Tablet, 1 (one) Tablet four times daily, as needed, #20, 11/29/2017, No Refill.

## 2017-11-29 NOTE — H&P (View-Only) (Signed)
George Floyd Documented: 11/29/2017 1:35 PM Location: [facility] Patient #: [id number] DOB: 03/12/1965 Single / Language: English / Race: Black or African American Male  History of Present Illness (Zerline Melchior A. Canesha Tesfaye MD; 11/29/2017 3:05 PM) Patient words: Patient sent at the request of Dr. Avbuere for swelling in his right groin. This has been present for an unknown period of time according to the patient. Over the last month, he has had more right groin pain and swelling especially with coughing, standing or pushing. He denies nausea or vomiting or change in bowel or bladder function.  The patient is a 52 year old male.   Past Surgical History ([name], [name]; 11/29/2017 1:35 PM) No pertinent past surgical history  Diagnostic Studies History ([name], [name]; 11/29/2017 1:35 PM) Colonoscopy never  Allergies ([name], [name]; 11/29/2017 1:35 PM) No Known Drug Allergies [11/29/2017]:  Medication History ([name], [name]; 11/29/2017 1:36 PM) Levothyroxine Sodium (Oral) Specific strength unknown - Active. Loratadine (Oral) Specific strength unknown - Active. Medications Reconciled  Social History ([name], [name]; 11/29/2017 1:35 PM) Alcohol use Remotely quit alcohol use. Caffeine use Carbonated beverages, Coffee, Tea. No drug use Tobacco use Current every day smoker.  Family History ([name], [name]; 11/29/2017 1:35 PM) First Degree Relatives No pertinent family history  Other Problems ([name], [name]; 11/29/2017 1:35 PM) Cerebrovascular Accident Thyroid Disease Ventral Hernia Repair     Review of Systems (Cliff Damiani A. Trameka Dorough MD; 11/29/2017 3:06 PM) General Present- Appetite Loss, Fatigue and Weight Loss. Not Present- Chills, Fever, Night Sweats and Weight Gain. Skin Present- Non-Healing Wounds. Not Present- Change in Wart/Mole, Dryness, Hives, Jaundice, New Lesions, Rash and Ulcer. HEENT  Present- Sinus Pain and Wears glasses/contact lenses. Not Present- Earache, Hearing Loss, Hoarseness, Nose Bleed, Oral Ulcers, Ringing in the Ears, Seasonal Allergies, Sore Throat, Visual Disturbances and Yellow Eyes. Respiratory Present- Wheezing. Not Present- Bloody sputum, Chronic Cough, Difficulty Breathing and Snoring. Breast Not Present- Breast Mass, Breast Pain, Nipple Discharge and Skin Changes. Cardiovascular Not Present- Chest Pain, Difficulty Breathing Lying Down, Leg Cramps, Palpitations, Rapid Heart Rate, Shortness of Breath and Swelling of Extremities. Gastrointestinal Present- Abdominal Pain. Not Present- Bloating, Bloody Stool, Change in Bowel Habits, Chronic diarrhea, Constipation, Difficulty Swallowing, Excessive gas, Gets full quickly at meals, Hemorrhoids, Indigestion, Nausea, Rectal Pain and Vomiting. All other systems negative  Vitals ([name]; 11/29/2017 1:35 PM) 11/29/2017 1:35 PM Weight: 173.38 lb Height: 73in Body Surface Area: 2.03 m Body Mass Index: 22.87 kg/m  Pulse: 67 (Regular)  BP: 108/68 (Sitting, Left Arm, Standard)      Physical Exam (Kieon Lawhorn A. Jadesola Poynter MD; 11/29/2017 3:05 PM)  General Mental Status-Alert. General Appearance-Consistent with stated age. Hydration-Well hydrated. Voice-Normal.  Head and Neck Head-normocephalic, atraumatic with no lesions or palpable masses. Trachea-midline. Thyroid Gland Characteristics - normal size and consistency.  Eye Eyeball - Bilateral-Extraocular movements intact. Sclera/Conjunctiva - Bilateral-No scleral icterus.  Chest and Lung Exam Chest and lung exam reveals -quiet, even and easy respiratory effort with no use of accessory muscles and on auscultation, normal breath sounds, no adventitious sounds and normal vocal resonance. Inspection Chest Wall - Normal. Back - normal.  Cardiovascular Cardiovascular examination reveals -normal heart sounds, regular rate  and rhythm with no murmurs and normal pedal pulses bilaterally.  Abdomen Note: Reducible right inguinal hernia. No evidence of left inguinal hernia.  Male Genitourinary Note: Normal  Neurologic Neurologic evaluation reveals -alert and oriented x 3 with no impairment of   recent or remote memory. Mental Status-Normal.  Musculoskeletal Normal Exam - Left-Upper Extremity Strength Normal and Lower Extremity Strength Normal. Normal Exam - Right-Upper Extremity Strength Normal and Lower Extremity Strength Normal.    Assessment & Plan (Yui Mulvaney A. Wynonna Fitzhenry MD; 11/29/2017 3:05 PM)  RIGHT INGUINAL HERNIA (K40.90) Impression: Discussed open repair with mesh. Laparoscopic techniques discussed risk and benefits discussed. He has opted for repair of right inguinal hernia with mesh. The risk of hernia repair include bleeding, infection, organ injury, bowel injury, bladder injury, nerve injury recurrent hernia, blood clots, worsening of underlying condition, chronic pain, mesh use, open surgery, death, and the need for other operattions. Pt agrees to proceed  Current Plans You are being scheduled for surgery- Our schedulers will call you.  You should hear from our office's scheduling department within 5 working days about the location, date, and time of surgery. We try to make accommodations for patient's preferences in scheduling surgery, but sometimes the OR schedule or the surgeon's schedule prevents us from making those accommodations.  If you have not heard from our office ([phone]) in 5 working days, call the office and ask for your surgeon's nurse.  If you have other questions about your diagnosis, plan, or surgery, call the office and ask for your surgeon's nurse.  Pt Education - Pamphlet Given - Hernia Surgery: discussed with patient and provided information. The anatomy & physiology of the abdominal wall and pelvic floor was discussed. The pathophysiology of hernias in the  inguinal and pelvic region was discussed. Natural history risks such as progressive enlargement, pain, incarceration, and strangulation was discussed. Contributors to complications such as smoking, obesity, diabetes, prior surgery, etc were discussed.  I feel the risks of no intervention will lead to serious problems that outweigh the operative risks; therefore, I recommended surgery to reduce and repair the hernia. I explained an open approach. I noted usual use of mesh to patch and/or buttress hernia repair  Risks such as bleeding, infection, abscess, need for further treatment, heart attack, death, and other risks were discussed. I noted a good likelihood this will help address the problem. Goals of post-operative recovery were discussed as well. Possibility that this will not correct all symptoms was explained. I stressed the importance of low-impact activity, aggressive pain control, avoiding constipation, & not pushing through pain to minimize risk of post-operative chronic pain or injury. Possibility of reherniation was discussed. We will work to minimize complications.  An educational handout further explaining the pathology & treatment options was given as well. Questions were answered. The patient expresses understanding & wishes to proceed with surgery.  Started Naprosyn 250 MG Oral Tablet, 1 (one) Tablet four times daily, as needed, #20, 11/29/2017, No Refill. 

## 2017-11-30 ENCOUNTER — Ambulatory Visit (HOSPITAL_BASED_OUTPATIENT_CLINIC_OR_DEPARTMENT_OTHER)
Admission: RE | Admit: 2017-11-30 | Discharge: 2017-11-30 | Disposition: A | Discharge status: Home / Self Care | Payer: Medicaid Other | Source: Ambulatory Visit | Attending: Surgery | Admitting: Surgery

## 2017-11-30 ENCOUNTER — Ambulatory Visit (HOSPITAL_BASED_OUTPATIENT_CLINIC_OR_DEPARTMENT_OTHER): Payer: Medicaid Other | Admitting: Anesthesiology

## 2017-11-30 ENCOUNTER — Encounter (HOSPITAL_BASED_OUTPATIENT_CLINIC_OR_DEPARTMENT_OTHER)
Admission: RE | Disposition: A | Discharge status: Home / Self Care | Payer: Self-pay | Source: Ambulatory Visit | Attending: Surgery

## 2017-11-30 ENCOUNTER — Encounter (HOSPITAL_BASED_OUTPATIENT_CLINIC_OR_DEPARTMENT_OTHER): Payer: Self-pay | Admitting: Anesthesiology

## 2017-11-30 DIAGNOSIS — Z79899 Other long term (current) drug therapy: Secondary | ICD-10-CM | POA: Insufficient documentation

## 2017-11-30 DIAGNOSIS — E039 Hypothyroidism, unspecified: Secondary | ICD-10-CM | POA: Diagnosis not present

## 2017-11-30 DIAGNOSIS — Z8673 Personal history of transient ischemic attack (TIA), and cerebral infarction without residual deficits: Secondary | ICD-10-CM | POA: Diagnosis not present

## 2017-11-30 DIAGNOSIS — F17200 Nicotine dependence, unspecified, uncomplicated: Secondary | ICD-10-CM | POA: Diagnosis not present

## 2017-11-30 DIAGNOSIS — Z79890 Hormone replacement therapy (postmenopausal): Secondary | ICD-10-CM | POA: Diagnosis not present

## 2017-11-30 DIAGNOSIS — K4090 Unilateral inguinal hernia, without obstruction or gangrene, not specified as recurrent: Secondary | ICD-10-CM | POA: Diagnosis present

## 2017-11-30 HISTORY — PX: INGUINAL HERNIA REPAIR: SHX194

## 2017-11-30 HISTORY — DX: Hypothyroidism, unspecified: E03.9

## 2017-11-30 HISTORY — PX: INSERTION OF MESH: SHX5868

## 2017-11-30 LAB — POCT I-STAT, CHEM 8
BUN: 11 mg/dL (ref 6–20)
CALCIUM ION: 1.15 mmol/L (ref 1.15–1.40)
CHLORIDE: 103 mmol/L (ref 98–111)
Creatinine, Ser: 1.1 mg/dL (ref 0.61–1.24)
GLUCOSE: 91 mg/dL (ref 70–99)
HCT: 38 % — ABNORMAL LOW (ref 39.0–52.0)
Hemoglobin: 12.9 g/dL — ABNORMAL LOW (ref 13.0–17.0)
Potassium: 3.8 mmol/L (ref 3.5–5.1)
SODIUM: 141 mmol/L (ref 135–145)
TCO2: 27 mmol/L (ref 22–32)

## 2017-11-30 SURGERY — REPAIR, HERNIA, INGUINAL, ADULT
Anesthesia: General | Site: Inguinal | Laterality: Right

## 2017-11-30 MED ORDER — SCOPOLAMINE 1 MG/3DAYS TD PT72: 1.0000 | MEDICATED_PATCH | Freq: Once | TRANSDERMAL | Status: DC | PRN

## 2017-11-30 MED ORDER — LIDOCAINE HCL (CARDIAC) PF 100 MG/5ML IV SOSY
PREFILLED_SYRINGE | INTRAVENOUS | Status: DC | PRN
  Administered 2017-11-30: 100 mg via INTRAVENOUS

## 2017-11-30 MED ORDER — OXYCODONE HCL 5 MG/5ML PO SOLN: 5.0000 mg | Freq: Once | ORAL | Status: AC | PRN

## 2017-11-30 MED ORDER — GABAPENTIN 300 MG PO CAPS
ORAL_CAPSULE | ORAL | Status: AC
  Filled 2017-11-30: qty 1

## 2017-11-30 MED ORDER — OXYCODONE HCL 5 MG PO TABS
ORAL_TABLET | ORAL | Status: AC
  Filled 2017-11-30: qty 1

## 2017-11-30 MED ORDER — SUGAMMADEX SODIUM 200 MG/2ML IV SOLN
INTRAVENOUS | Status: DC | PRN
  Administered 2017-11-30: 200 mg via INTRAVENOUS

## 2017-11-30 MED ORDER — GABAPENTIN 300 MG PO CAPS
300.0000 mg | ORAL_CAPSULE | ORAL | Status: AC
  Administered 2017-11-30: 300 mg via ORAL

## 2017-11-30 MED ORDER — ROCURONIUM BROMIDE 50 MG/5ML IV SOSY
PREFILLED_SYRINGE | INTRAVENOUS | Status: AC
  Filled 2017-11-30: qty 5

## 2017-11-30 MED ORDER — ROCURONIUM BROMIDE 100 MG/10ML IV SOLN
INTRAVENOUS | Status: DC | PRN
  Administered 2017-11-30: 40 mg via INTRAVENOUS

## 2017-11-30 MED ORDER — FENTANYL CITRATE (PF) 100 MCG/2ML IJ SOLN
INTRAMUSCULAR | Status: AC
  Filled 2017-11-30: qty 2

## 2017-11-30 MED ORDER — BUPIVACAINE-EPINEPHRINE 0.25% -1:200000 IJ SOLN
INTRAMUSCULAR | Status: DC | PRN
  Administered 2017-11-30: 9 mL

## 2017-11-30 MED ORDER — 0.9 % SODIUM CHLORIDE (POUR BTL) OPTIME
TOPICAL | Status: DC | PRN
  Administered 2017-11-30: 100 mL

## 2017-11-30 MED ORDER — OXYCODONE HCL 5 MG PO TABS: 5.0000 mg | ORAL_TABLET | Freq: Four times a day (QID) | ORAL | 0 refills | Status: AC | PRN

## 2017-11-30 MED ORDER — KETOROLAC TROMETHAMINE 30 MG/ML IJ SOLN: 30.0000 mg | Freq: Once | INTRAMUSCULAR | Status: DC | PRN

## 2017-11-30 MED ORDER — FENTANYL CITRATE (PF) 100 MCG/2ML IJ SOLN: 25.0000 ug | INTRAMUSCULAR | Status: DC | PRN

## 2017-11-30 MED ORDER — ACETAMINOPHEN 325 MG PO TABS: 325.0000 mg | ORAL_TABLET | ORAL | Status: DC | PRN

## 2017-11-30 MED ORDER — EPHEDRINE SULFATE-NACL 50-0.9 MG/10ML-% IV SOSY
PREFILLED_SYRINGE | INTRAVENOUS | Status: DC | PRN
  Administered 2017-11-30: 15 mg via INTRAVENOUS
  Administered 2017-11-30: 10 mg via INTRAVENOUS

## 2017-11-30 MED ORDER — DEXAMETHASONE SODIUM PHOSPHATE 4 MG/ML IJ SOLN
INTRAMUSCULAR | Status: DC | PRN
  Administered 2017-11-30: 10 mg via INTRAVENOUS

## 2017-11-30 MED ORDER — CELECOXIB 200 MG PO CAPS
ORAL_CAPSULE | ORAL | Status: AC
  Filled 2017-11-30: qty 2

## 2017-11-30 MED ORDER — LACTATED RINGERS IV SOLN
INTRAVENOUS | Status: DC
  Administered 2017-11-30 (×2): via INTRAVENOUS

## 2017-11-30 MED ORDER — PHENYLEPHRINE 40 MCG/ML (10ML) SYRINGE FOR IV PUSH (FOR BLOOD PRESSURE SUPPORT)
PREFILLED_SYRINGE | INTRAVENOUS | Status: DC | PRN
  Administered 2017-11-30 (×3): 80 ug via INTRAVENOUS
  Administered 2017-11-30: 120 ug via INTRAVENOUS

## 2017-11-30 MED ORDER — CHLORHEXIDINE GLUCONATE CLOTH 2 % EX PADS: 6.0000 | MEDICATED_PAD | Freq: Once | CUTANEOUS | Status: DC

## 2017-11-30 MED ORDER — ROPIVACAINE HCL 5 MG/ML IJ SOLN
INTRAMUSCULAR | Status: DC | PRN
  Administered 2017-11-30 (×6): 5 mL via PERINEURAL

## 2017-11-30 MED ORDER — ACETAMINOPHEN 160 MG/5ML PO SOLN: 325.0000 mg | ORAL | Status: DC | PRN

## 2017-11-30 MED ORDER — CEFAZOLIN SODIUM-DEXTROSE 2-4 GM/100ML-% IV SOLN
2.0000 g | INTRAVENOUS | Status: AC
  Administered 2017-11-30: 2 g via INTRAVENOUS

## 2017-11-30 MED ORDER — ACETAMINOPHEN 500 MG PO TABS
ORAL_TABLET | ORAL | Status: AC
  Filled 2017-11-30: qty 2

## 2017-11-30 MED ORDER — LIDOCAINE 2% (20 MG/ML) 5 ML SYRINGE
INTRAMUSCULAR | Status: AC
  Filled 2017-11-30: qty 5

## 2017-11-30 MED ORDER — ONDANSETRON HCL 4 MG/2ML IJ SOLN: 4.0000 mg | Freq: Once | INTRAMUSCULAR | Status: DC | PRN

## 2017-11-30 MED ORDER — CEFAZOLIN SODIUM-DEXTROSE 2-4 GM/100ML-% IV SOLN
INTRAVENOUS | Status: AC
  Filled 2017-11-30: qty 100

## 2017-11-30 MED ORDER — FENTANYL CITRATE (PF) 100 MCG/2ML IJ SOLN
50.0000 ug | INTRAMUSCULAR | Status: DC | PRN
  Administered 2017-11-30: 100 ug via INTRAVENOUS

## 2017-11-30 MED ORDER — OXYCODONE HCL 5 MG PO TABS
5.0000 mg | ORAL_TABLET | Freq: Once | ORAL | Status: AC | PRN
  Administered 2017-11-30: 5 mg via ORAL

## 2017-11-30 MED ORDER — MIDAZOLAM HCL 2 MG/2ML IJ SOLN
INTRAMUSCULAR | Status: AC
  Filled 2017-11-30: qty 2

## 2017-11-30 MED ORDER — IBUPROFEN 800 MG PO TABS: 800.0000 mg | ORAL_TABLET | Freq: Three times a day (TID) | ORAL | 0 refills | Status: AC | PRN

## 2017-11-30 MED ORDER — PROPOFOL 10 MG/ML IV BOLUS
INTRAVENOUS | Status: AC
  Filled 2017-11-30: qty 20

## 2017-11-30 MED ORDER — PROPOFOL 10 MG/ML IV BOLUS
INTRAVENOUS | Status: DC | PRN
  Administered 2017-11-30: 150 mg via INTRAVENOUS

## 2017-11-30 MED ORDER — SUGAMMADEX SODIUM 200 MG/2ML IV SOLN
INTRAVENOUS | Status: AC
  Filled 2017-11-30: qty 2

## 2017-11-30 MED ORDER — ACETAMINOPHEN 500 MG PO TABS
1000.0000 mg | ORAL_TABLET | ORAL | Status: AC
  Administered 2017-11-30: 1000 mg via ORAL

## 2017-11-30 MED ORDER — EPHEDRINE SULFATE-NACL 50-0.9 MG/10ML-% IV SOSY: PREFILLED_SYRINGE | INTRAVENOUS | Status: DC | PRN

## 2017-11-30 MED ORDER — MIDAZOLAM HCL 2 MG/2ML IJ SOLN
1.0000 mg | INTRAMUSCULAR | Status: DC | PRN
  Administered 2017-11-30: 2 mg via INTRAVENOUS

## 2017-11-30 MED ORDER — CELECOXIB 400 MG PO CAPS
400.0000 mg | ORAL_CAPSULE | ORAL | Status: AC
  Administered 2017-11-30: 400 mg via ORAL

## 2017-11-30 MED ORDER — MEPERIDINE HCL 25 MG/ML IJ SOLN: 6.2500 mg | INTRAMUSCULAR | Status: DC | PRN

## 2017-11-30 MED ORDER — EPHEDRINE 5 MG/ML INJ
INTRAVENOUS | Status: AC
  Filled 2017-11-30: qty 10

## 2017-11-30 SURGICAL SUPPLY — 50 items
ADH SKN CLS APL DERMABOND .7 (GAUZE/BANDAGES/DRESSINGS) ×2
BLADE CLIPPER SURG (BLADE) ×2 IMPLANT
BLADE SURG 15 STRL LF DISP TIS (BLADE) ×2 IMPLANT
BLADE SURG 15 STRL SS (BLADE) ×4
CANISTER SUCT 1200ML W/VALVE (MISCELLANEOUS) ×2 IMPLANT
CHLORAPREP W/TINT 26ML (MISCELLANEOUS) ×4 IMPLANT
COVER BACK TABLE 60X90IN (DRAPES) ×4 IMPLANT
COVER MAYO STAND STRL (DRAPES) ×4 IMPLANT
COVER WAND RF STERILE (DRAPES) IMPLANT
DECANTER SPIKE VIAL GLASS SM (MISCELLANEOUS) IMPLANT
DERMABOND ADVANCED (GAUZE/BANDAGES/DRESSINGS) ×2
DERMABOND ADVANCED .7 DNX12 (GAUZE/BANDAGES/DRESSINGS) ×2 IMPLANT
DRAIN PENROSE 1/2X12 LTX STRL (WOUND CARE) ×4 IMPLANT
DRAPE LAPAROTOMY TRNSV 102X78 (DRAPE) ×4 IMPLANT
DRAPE UTILITY XL STRL (DRAPES) ×4 IMPLANT
ELECT COATED BLADE 2.86 ST (ELECTRODE) ×4 IMPLANT
ELECT REM PT RETURN 9FT ADLT (ELECTROSURGICAL) ×4
ELECTRODE REM PT RTRN 9FT ADLT (ELECTROSURGICAL) ×2 IMPLANT
GAUZE 4X4 16PLY RFD (DISPOSABLE) IMPLANT
GAUZE SPONGE 4X4 12PLY STRL LF (GAUZE/BANDAGES/DRESSINGS) IMPLANT
GLOVE BIO SURGEON STRL SZ 6.5 (GLOVE) ×1 IMPLANT
GLOVE BIO SURGEONS STRL SZ 6.5 (GLOVE) ×1
GLOVE BIOGEL PI IND STRL 8 (GLOVE) ×2 IMPLANT
GLOVE BIOGEL PI INDICATOR 8 (GLOVE) ×2
GLOVE ECLIPSE 8.0 STRL XLNG CF (GLOVE) ×4 IMPLANT
GOWN STRL REUS W/ TWL LRG LVL3 (GOWN DISPOSABLE) ×4 IMPLANT
GOWN STRL REUS W/TWL LRG LVL3 (GOWN DISPOSABLE) ×8
MESH HERNIA SYS ULTRAPRO LRG (Mesh General) ×2 IMPLANT
NDL HYPO 25X1 1.5 SAFETY (NEEDLE) ×2 IMPLANT
NEEDLE HYPO 25X1 1.5 SAFETY (NEEDLE) ×4 IMPLANT
NS IRRIG 1000ML POUR BTL (IV SOLUTION) IMPLANT
PACK BASIN DAY SURGERY FS (CUSTOM PROCEDURE TRAY) ×4 IMPLANT
PENCIL BUTTON HOLSTER BLD 10FT (ELECTRODE) ×4 IMPLANT
SLEEVE SCD COMPRESS KNEE MED (MISCELLANEOUS) ×4 IMPLANT
SPONGE LAP 4X18 RFD (DISPOSABLE) ×4 IMPLANT
SUT MON AB 4-0 PC3 18 (SUTURE) ×4 IMPLANT
SUT NOVA 0 T19/GS 22DT (SUTURE) ×8 IMPLANT
SUT VIC AB 2-0 SH 27 (SUTURE) ×4
SUT VIC AB 2-0 SH 27XBRD (SUTURE) ×2 IMPLANT
SUT VIC AB 3-0 54X BRD REEL (SUTURE) IMPLANT
SUT VIC AB 3-0 BRD 54 (SUTURE)
SUT VICRYL 3-0 CR8 SH (SUTURE) ×6 IMPLANT
SUT VICRYL AB 2 0 TIE (SUTURE) IMPLANT
SUT VICRYL AB 2 0 TIES (SUTURE)
SYR CONTROL 10ML LL (SYRINGE) ×4 IMPLANT
TOWEL GREEN STERILE FF (TOWEL DISPOSABLE) ×4 IMPLANT
TOWEL OR NON WOVEN STRL DISP B (DISPOSABLE) ×4 IMPLANT
TUBE CONNECTING 20'X1/4 (TUBING) ×1
TUBE CONNECTING 20X1/4 (TUBING) ×1 IMPLANT
YANKAUER SUCT BULB TIP NO VENT (SUCTIONS) ×2 IMPLANT

## 2017-11-30 NOTE — Anesthesia Preprocedure Evaluation (Signed)
Anesthesia Evaluation  Patient identified by MRN, date of birth, ID band Patient awake    Reviewed: Allergy & Precautions, NPO status , Patient's Chart, lab work & pertinent test results  Airway Mallampati: I       Dental  (+) Poor Dentition, Loose, Missing   Pulmonary Current Smoker,    Pulmonary exam normal breath sounds clear to auscultation       Cardiovascular Normal cardiovascular exam Rhythm:Regular Rate:Normal     Neuro/Psych Anxiety negative neurological ROS     GI/Hepatic negative GI ROS, (+)     substance abuse  cocaine use,   Endo/Other  Hypothyroidism   Renal/GU negative Renal ROS  negative genitourinary   Musculoskeletal   Abdominal Normal abdominal exam  (+)   Peds  Hematology   Anesthesia Other Findings   Reproductive/Obstetrics                             Anesthesia Physical Anesthesia Plan  ASA: II  Anesthesia Plan: General   Post-op Pain Management:  Regional for Post-op pain   Induction: Intravenous  PONV Risk Score and Plan: 2 and Ondansetron and Dexamethasone  Airway Management Planned: Oral ETT  Additional Equipment:   Intra-op Plan:   Post-operative Plan: Extubation in OR  Informed Consent: I have reviewed the patients History and Physical, chart, labs and discussed the procedure including the risks, benefits and alternatives for the proposed anesthesia with the patient or authorized representative who has indicated his/her understanding and acceptance.   Dental advisory given  Plan Discussed with: CRNA and Surgeon  Anesthesia Plan Comments:         Anesthesia Quick Evaluation

## 2017-11-30 NOTE — Anesthesia Procedure Notes (Addendum)
Anesthesia Regional Block: TAP block   Pre-Anesthetic Checklist: ,, timeout performed, Correct Patient, Correct Site, Correct Laterality, Correct Procedure, Correct Position, site marked, Risks and benefits discussed,  Surgical consent,  Pre-op evaluation,  At surgeon's request and post-op pain management  Laterality: Left  Prep: chloraprep       Needles:  Injection technique: Single-shot  Needle Type: Echogenic Stimulator Needle     Needle Length: 10cm  Needle Gauge: 21   Needle insertion depth: 2 cm   Additional Needles:   Procedures:,,,, ultrasound used (permanent image in chart),,,,  Narrative:  Start time: 11/30/2017 12:16 PM End time: 11/30/2017 12:26 PM Injection made incrementally with aspirations every 5 mL.  Performed by: Personally  Anesthesiologist: Leilani AbleHatchett, Heidi Lemay, MD

## 2017-11-30 NOTE — Interval H&P Note (Signed)
History and Physical Interval Note:  11/30/2017 11:51 AM  George Floyd  has presented today for surgery, with the diagnosis of Right inguinal hernia  The various methods of treatment have been discussed with the patient and family. After consideration of risks, benefits and other options for treatment, the patient has consented to  Procedure(s): RIGHT INGUINAL HERNIA REPAIR ERAS PATHWAY (Right) INSERTION OF MESH (Right) as a surgical intervention .  The patient's history has been reviewed, patient examined, no change in status, stable for surgery.  I have reviewed the patient's chart and labs.  Questions were answered to the patient's satisfaction.     Lady Wisham A Leanah Kolander

## 2017-11-30 NOTE — Discharge Instructions (Signed)
[facility] _______[facility] ° °UMBILICAL OR INGUINAL HERNIA REPAIR: POST OP INSTRUCTIONS ° °Always review your discharge instruction sheet given to you by the facility where your surgery was performed. °IF YOU HAVE DISABILITY OR FAMILY LEAVE FORMS, YOU MUST BRING THEM TO THE OFFICE FOR PROCESSING.   °DO NOT GIVE THEM TO YOUR DOCTOR. ° °1. A  prescription for pain medication may be given to you upon discharge.  Take your pain medication as prescribed, if needed.  If narcotic pain medicine is not needed, then you may take acetaminophen (Tylenol) or ibuprofen (Advil) as needed. °2. Take your usually prescribed medications unless otherwise directed. °If you need a refill on your pain medication, please contact your pharmacy.  They will contact our office to request authorization. Prescriptions will not be filled after 5 pm or on week-ends. °3. You should follow a light diet the first 24 hours after arrival home, such as soup and crackers, etc.  Be sure to include lots of fluids daily.  Resume your normal diet the day after surgery. °4.Most patients will experience some swelling and bruising around the umbilicus or in the groin and scrotum.  Ice packs and reclining will help.  Swelling and bruising can take several days to resolve.  °6. It is common to experience some constipation if taking pain medication after surgery.  Increasing fluid intake and taking a stool softener (such as Colace) will usually help or prevent this problem from occurring.  A mild laxative (Milk of Magnesia or Miralax) should be taken according to package directions if there are no bowel movements after 48 hours. °7. Unless discharge instructions indicate otherwise, you may remove your bandages 24-48 hours after surgery, and you may shower at that time.  You may have steri-strips (small skin tapes) in place directly over the incision.  These strips should be left on the skin for 7-10 days.  If your surgeon used skin glue on the  incision, you may shower in 24 hours.  The glue will flake off over the next 2-3 weeks.  Any sutures or staples will be removed at the office during your follow-up visit. °8. ACTIVITIES:  You may resume regular (light) daily activities beginning the next day--such as daily self-care, walking, climbing stairs--gradually increasing activities as tolerated.  You may have sexual intercourse when it is comfortable.  Refrain from any heavy lifting or straining until approved by your doctor. ° °a.You may drive when you are no longer taking prescription pain medication, you can comfortably wear a seatbelt, and you can safely maneuver your car and apply brakes. °b.RETURN TO WORK:   °_____________________________________________ ° °9.You should see your doctor in the office for a follow-up appointment approximately 2-3 weeks after your surgery.  Make sure that you call for this appointment within a day or two after you arrive home to insure a convenient appointment time. °10.OTHER INSTRUCTIONS: _________________________ °   _____________________________________ ° °WHEN TO CALL YOUR DOCTOR: °1. Fever over 101.0 °2. Inability to urinate °3. Nausea and/or vomiting °4. Extreme swelling or bruising °5. Continued bleeding from incision. °6. Increased pain, redness, or drainage from the incision ° °The clinic staff is available to answer your questions during regular business hours.  Please don’t hesitate to call and ask to speak to one of the nurses for clinical concerns.  If you have a medical emergency, go to the nearest emergency room or call 911.  A surgeon from [facility] is always on call at the hospital ° ° °  [address], [address], [place], [place]  [address] ? ° [address] [address], [place], [place]   [address] °[phone] ? [phone] ? FAX [phone] °Web site: [email or web address] ° ° °Post Anesthesia Home Care Instructions ° °Activity: °Get plenty of rest for the remainder of the day. A  responsible individual must stay with you for 24 hours following the procedure.  °For the next 24 hours, DO NOT: °-Drive a car °-Operate machinery °-Drink alcoholic beverages °-Take any medication unless instructed by your physician °-Make any legal decisions or sign important papers. ° °Meals: °Start with liquid foods such as gelatin or soup. Progress to regular foods as tolerated. Avoid greasy, spicy, heavy foods. If nausea and/or vomiting occur, drink only clear liquids until the nausea and/or vomiting subsides. Call your physician if vomiting continues. ° °Special Instructions/Symptoms: °Your throat may feel dry or sore from the anesthesia or the breathing tube placed in your throat during surgery. If this causes discomfort, gargle with warm salt water. The discomfort should disappear within 24 hours. ° °If you had a scopolamine patch placed behind your ear for the management of post- operative nausea and/or vomiting: ° °1. The medication in the patch is effective for 72 hours, after which it should be removed.  Wrap patch in a tissue and discard in the trash. Wash hands thoroughly with soap and water. °2. You may remove the patch earlier than 72 hours if you experience unpleasant side effects which may include dry mouth, dizziness or visual disturbances. °3. Avoid touching the patch. Wash your hands with soap and water after contact with the patch. °  ° °

## 2017-11-30 NOTE — Anesthesia Procedure Notes (Signed)
Procedure Name: Intubation Date/Time: 11/30/2017 12:39 PM Performed by: Burna Cashonrad, Namya Voges C, CRNA Pre-anesthesia Checklist: Patient identified, Emergency Drugs available, Suction available and Patient being monitored Patient Re-evaluated:Patient Re-evaluated prior to induction Oxygen Delivery Method: Circle system utilized Preoxygenation: Pre-oxygenation with 100% oxygen Induction Type: IV induction Ventilation: Mask ventilation without difficulty Laryngoscope Size: Miller and 2 Grade View: Grade I Tube type: Oral Tube size: 8.0 mm Number of attempts: 1 Airway Equipment and Method: Stylet and Oral airway Placement Confirmation: ETT inserted through vocal cords under direct vision,  positive ETCO2 and breath sounds checked- equal and bilateral Secured at: 25 cm Tube secured with: Tape Dental Injury: Teeth and Oropharynx as per pre-operative assessment

## 2017-11-30 NOTE — Op Note (Signed)
Right  Inguinal Hernia, Open, Procedure Note with mesh   Indications: The patient presented with a history of a right inguinal , reducible hernia.  The risk of hernia repair include bleeding,  Infection,   Recurrence of the hernia,  Mesh use, chronic pain,  Organ injury,  Bowel injury,  Bladder injury,   nerve injury with numbness around the incision,  Death,  and worsening of preexisting  medical problems.  The alternatives to surgery have been discussed as well..  Long term expectations of both operative and non operative treatments have been discussed.   The patient agrees to proceed.  Pre-operative Diagnosis: right reducible inguinal hernia   Post-operative Diagnosis: same  Surgeon: Clovis Puhomas A Mariyana Fulop   Assistants: OR staff   Anesthesia: General endotracheal anesthesia, Local anesthesia 0.25.% bupivacaine, with epinephrine and TAP   ASA Class: 3  Procedure Details  The patient was seen again in the Holding Room. The risks, benefits, complications, treatment options, and expected outcomes were discussed with the patient. The possibilities of reaction to medication, pulmonary aspiration, perforation of viscus, bleeding, recurrent infection, the need for additional procedures, and development of a complication requiring transfusion or further operation were discussed with the patient and/or family. There was concurrence with the proposed plan, and informed consent was obtained. The site of surgery was properly noted/marked. The patient was taken to the Operating Room, identified as George Floyd, and the procedure verified as hernia repair. A Time Out was held and the above information confirmed.  The patient was placed in the supine position and underwent induction of anesthesia, the lower abdomen and groin was prepped and draped in the standard fashion, and 0.25% Marcaine with epinephrine was used to anesthetize the skin over the mid-portion of the inguinal canal. A transverse incision was made.  Dissection was carried through the soft tissue to expose the inguinal canal and inguinal ligament along its lower edge. The external oblique fascia was split along the course of its fibers, exposing the inguinal canal. The cord and nerve were looped using a Penrose drain and reflected out of the field. The indirect defect was exposed and sac  dissected off the cord structures and reduced into the preperitoneal space. A  piece of prolene hernia system ultrapro mesh was and placed into the indirect  defect. Interupted 2-0 novafil suture was then used  to repair the defect, with the suture being sewn from the pubic tubercle inferiorly and superiorly along the canal to a level just beyond the internal ring. The mesh was split to allow passage of the cord and nerve into the canal without entrapment. The contents were then returned to canal and the external oblique fashion was then closed in a continuous fashion using 3-0 Vicryl suture taking care not to cause entrapment. Scarpa's layer closed with 3 0 vicryl and 4 0 monocryl used to close the skin.  Dermabond used for dressing.  Instrument, sponge, and needle counts were correct prior to closure and at the conclusion of the case.  Findings: Hernia as above  Estimated Blood Loss: less than 50 mL         Drains: None         Total IV Fluids: per record          Specimens: none               Complications: None; patient tolerated the procedure well.         Disposition: PACU - hemodynamically stable.  Condition: stable

## 2017-11-30 NOTE — Anesthesia Postprocedure Evaluation (Signed)
Anesthesia Post Note  Patient: George Floyd  Procedure(s) Performed: RIGHT INGUINAL HERNIA REPAIR (Right Inguinal) INSERTION OF MESH (Right Groin)     Patient location during evaluation: PACU Anesthesia Type: General Level of consciousness: awake and sedated Pain management: pain level controlled Vital Signs Assessment: post-procedure vital signs reviewed and stable Respiratory status: spontaneous breathing Cardiovascular status: stable Postop Assessment: no apparent nausea or vomiting Anesthetic complications: no    Last Vitals:  Vitals:   11/30/17 1345 11/30/17 1400  BP: 120/81 120/85  Pulse: 65 63  Resp: 15 15  Temp:    SpO2: 100% 100%    Last Pain:  Vitals:   11/30/17 1400  TempSrc:   PainSc: 1    Pain Goal:                 Caren MacadamJohn F Rashada Klontz Jr

## 2017-11-30 NOTE — Transfer of Care (Signed)
Immediate Anesthesia Transfer of Care Note  Patient: George Floyd  Procedure(s) Performed: RIGHT INGUINAL HERNIA REPAIR (Right Inguinal) INSERTION OF MESH (Right Groin)  Patient Location: PACU  Anesthesia Type:GA combined with regional for post-op pain  Level of Consciousness: sedated  Airway & Oxygen Therapy: Patient Spontanous Breathing and Patient connected to face mask oxygen  Post-op Assessment: Report given to RN and Post -op Vital signs reviewed and stable  Post vital signs: Reviewed and stable  Last Vitals:  Vitals Value Taken Time  BP 122/83 11/30/2017  1:38 PM  Temp 36.2 C 11/30/2017  1:38 PM  Pulse 64 11/30/2017  1:41 PM  Resp 12 11/30/2017  1:41 PM  SpO2 100 % 11/30/2017  1:41 PM  Vitals shown include unvalidated device data.  Last Pain:  Vitals:   11/30/17 1338  TempSrc:   PainSc: Asleep         Complications: No apparent anesthesia complications

## 2017-11-30 NOTE — Progress Notes (Signed)
Assisted Dr. Hatchett with right, ultrasound guided, transabdominal plane block. Side rails up, monitors on throughout procedure. See vital signs in flow sheet. Tolerated Procedure well. 

## 2017-12-01 ENCOUNTER — Encounter (HOSPITAL_BASED_OUTPATIENT_CLINIC_OR_DEPARTMENT_OTHER): Payer: Self-pay | Admitting: Surgery

## 2018-09-23 IMAGING — CT CT ABD-PELV W/ CM
2 of 5 series · 16 of 46 positions shown, 18 images · IV contrast (Omni 300)
Comparison: Prior CT abdomen/pelvis 04/12/2012

CLINICAL DATA: 52-year-old male with a suspected femoral hernia.

EXAM:
CT ABDOMEN AND PELVIS WITH CONTRAST
TECHNIQUE: Multidetector CT imaging of the abdomen and pelvis was performed
using the standard protocol following bolus administration of
intravenous contrast.
CONTRAST:  100mL OMNIPAQUE IOHEXOL 300 MG/ML  SOLN

[Series 3: a/p w/ 5mm · axial · 0.84mm/px · z∈[+947,+1387]mm · 13 of 100 slices shown, 15 images]
[im 6/100  soft-tissue]
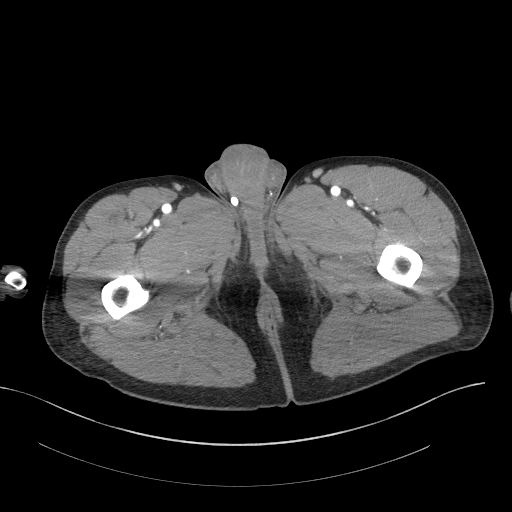
[im 6/100  bone]
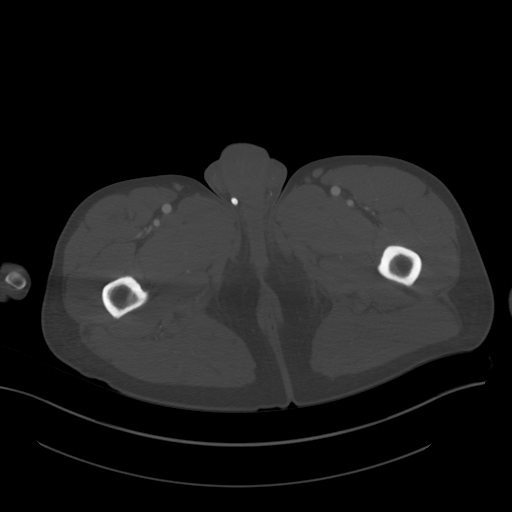
[im 12/100  soft-tissue]
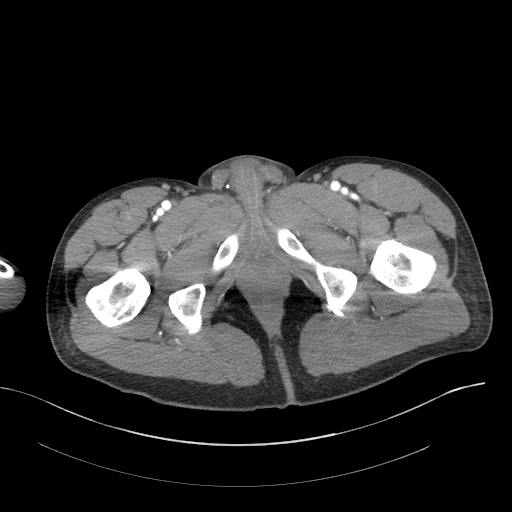
[im 23/100  soft-tissue]
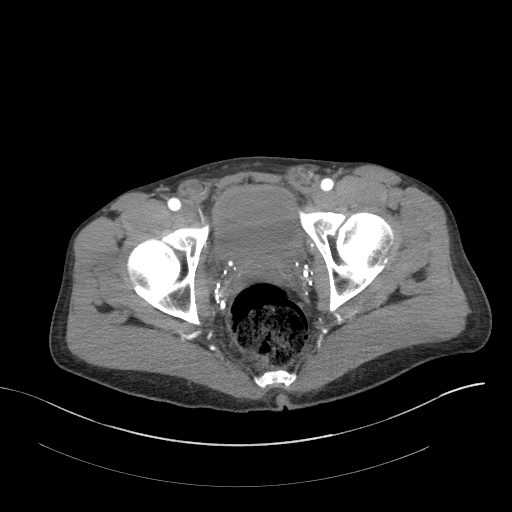
[im 28/100  soft-tissue]
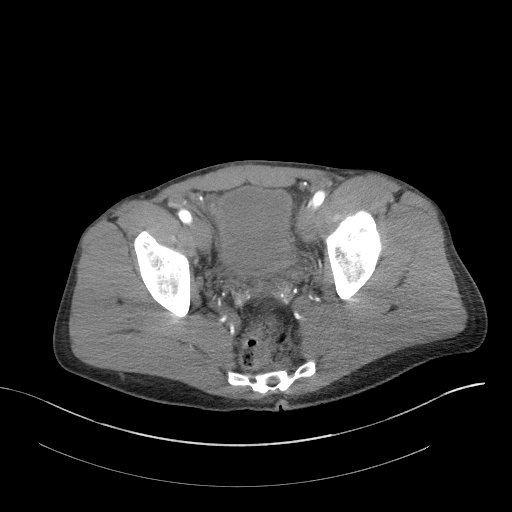
[im 34/100  soft-tissue]
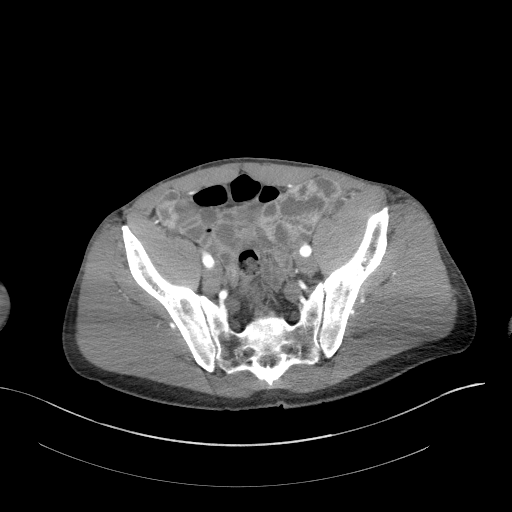
[im 45/100  soft-tissue]
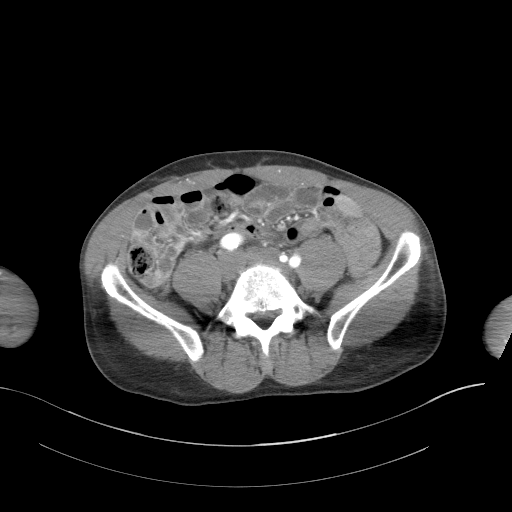
[im 50/100  soft-tissue]
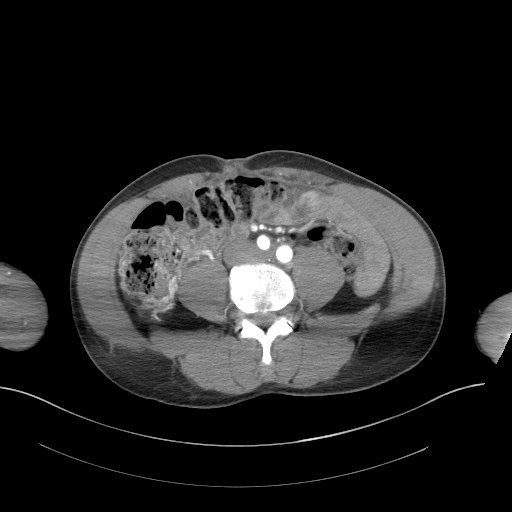
[im 56/100  soft-tissue]
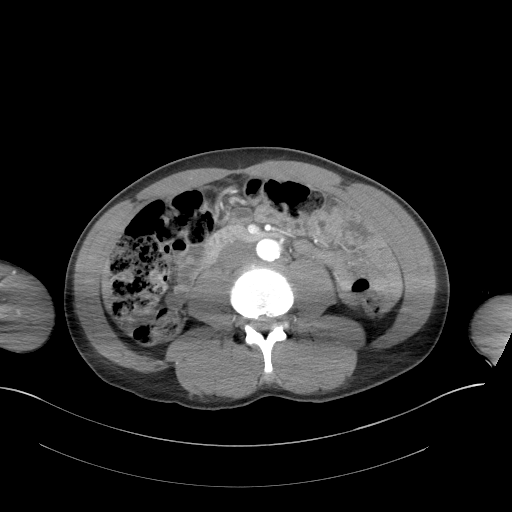
[im 67/100  soft-tissue]
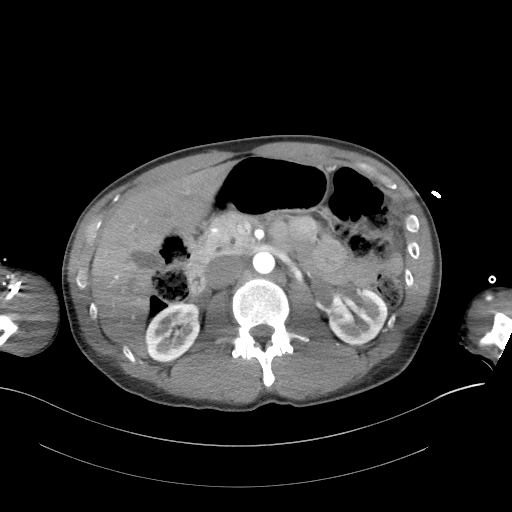
[im 67/100  bone]
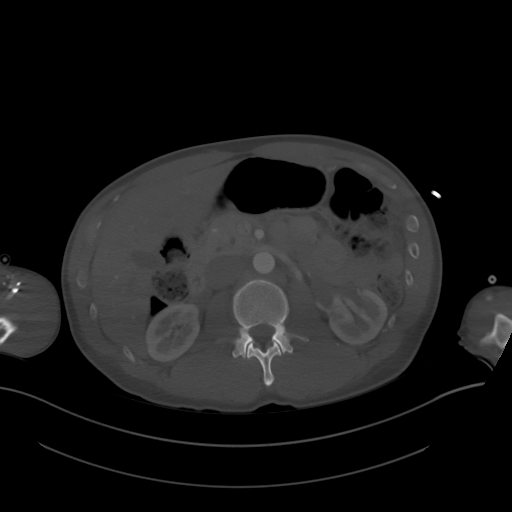
[im 72/100  soft-tissue]
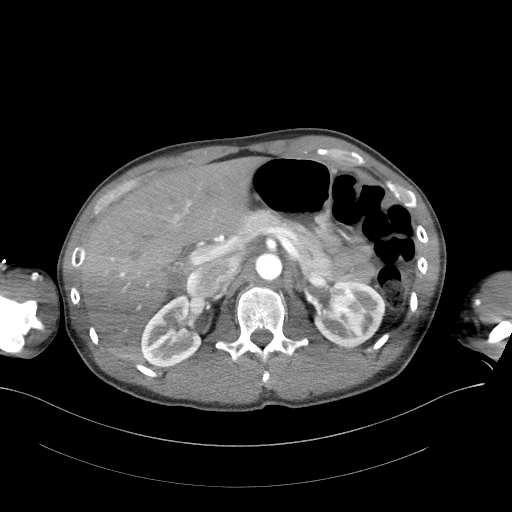
[im 78/100  soft-tissue]
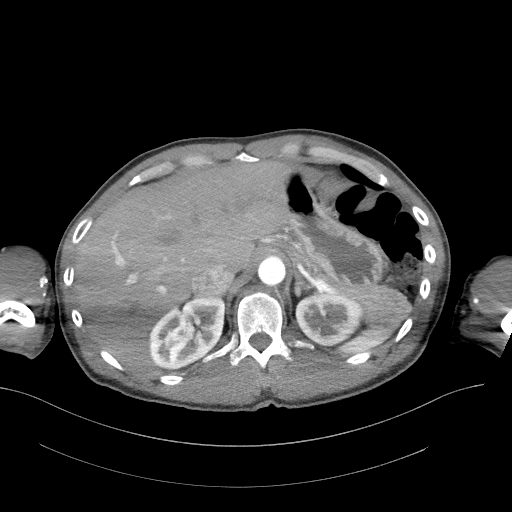
[im 89/100  soft-tissue]
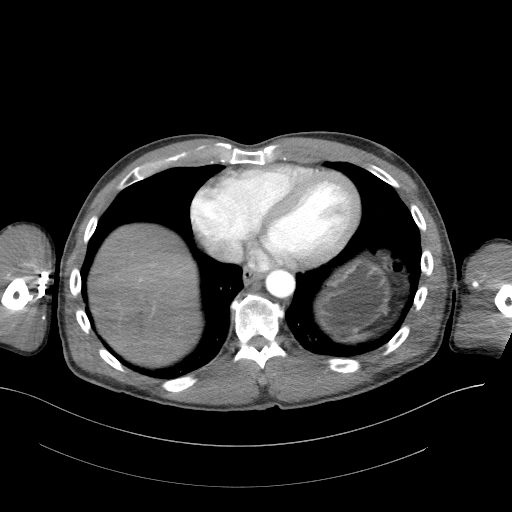
[im 94/100  soft-tissue]
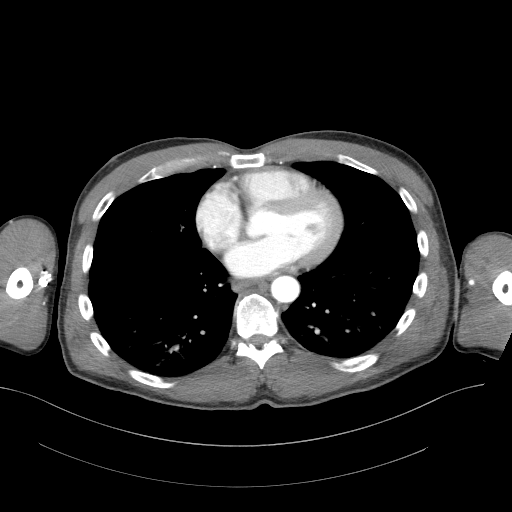

[Series 6: a/p w/ cor · coronal · 0.86mm/px · 3 of 145 slices shown]
[im 49/145  soft-tissue]
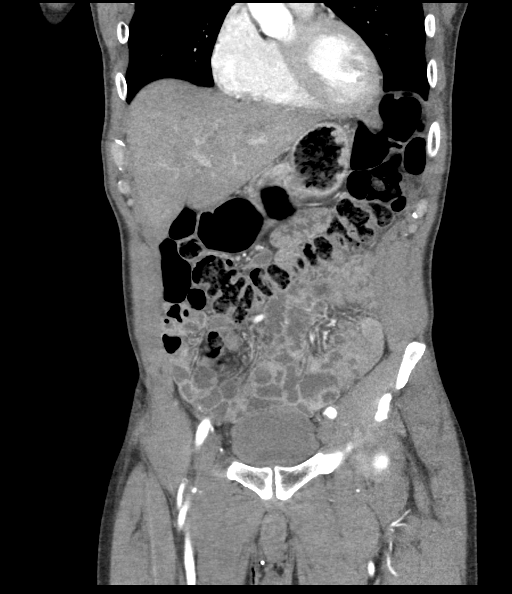
[im 65/145  soft-tissue]
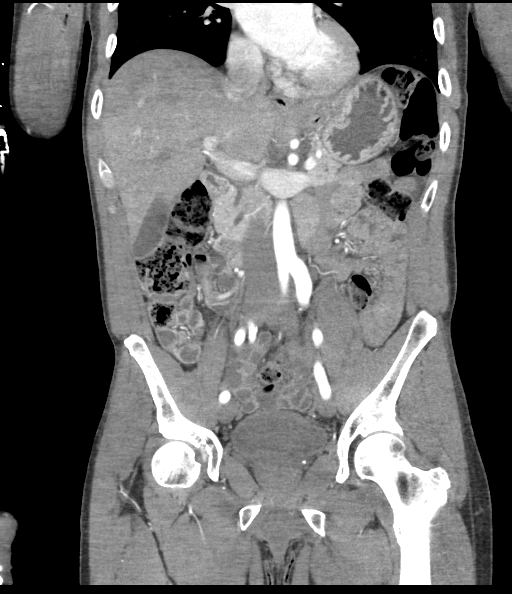
[im 81/145  soft-tissue]
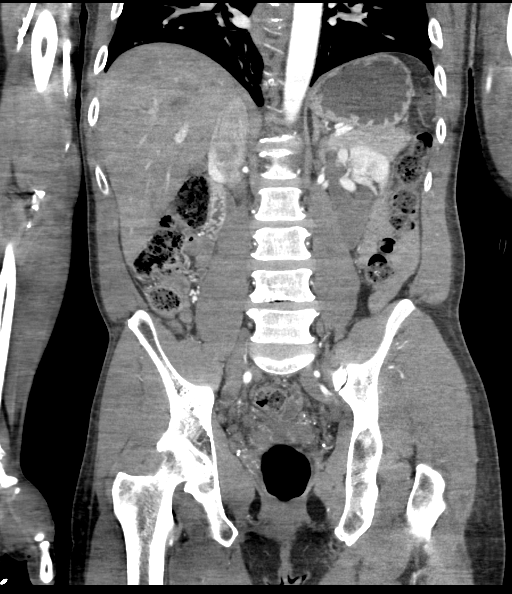

[16 of 46 positions shown; findings below may reference images not displayed]

FINDINGS: Lower chest: Tiny 2-3 mm nodules in the inferior right middle lobe
are noted incidentally. Trace dependent atelectasis. Small pulmonary
cyst in the periphery of the left lower lobe, unchanged. No focal
airspace consolidation. Visualized cardiac structures are normal in
size. No pericardial effusion.

Hepatobiliary: Normal hepatic contour and morphology. No discrete
hepatic lesions. Normal appearance of the gallbladder. No intra or
extrahepatic biliary ductal dilatation.

Pancreas: Unremarkable. No pancreatic ductal dilatation or
surrounding inflammatory changes.

Spleen: Normal in size without focal abnormality.

Adrenals/Urinary Tract: No evidence of nephrolithiasis or
hydronephrosis. No enhancing renal mass. Extrarenal pelvis on the
left, similar compared to prior imaging. A stone is present in the
region of the right ureterovesicular junction. Given the relative
absence of hydronephrosis, this may represent a recently passed
stone. The bladder is otherwise unremarkable.

Stomach/Bowel: No evidence of obstruction or focal bowel wall
thickening. Normal appendix in the right lower quadrant. The
terminal ileum is unremarkable.

Vascular/Lymphatic: No significant vascular findings are present. No
enlarged abdominal or pelvic lymph nodes.

Reproductive: Prostate is unremarkable.

Other: No abdominal wall hernia or abnormality. No abdominopelvic
ascites.

Musculoskeletal: No acute fracture or aggressive appearing lytic or
blastic osseous lesion.
IMPRESSION: 1. Punctate stone in the bladder adjacent to the right
ureterovesicular junction. Given the absence of associated right
hydro nephrosis, this likely represents a recently passed stone.
2. Small 2-3 mm right middle lobe pulmonary nodules. No follow-up
needed if patient is low-risk (and has no known or suspected primary
neoplasm). Non-contrast chest CT can be considered in 12 months if
patient is high-risk. This recommendation follows the consensus
statement: Guidelines for Management of Incidental Pulmonary Nodules
Detected on CT Images:From the [HOSPITAL] 7068; published
online before print (10.1148/radiol.0471767673).

## 2019-12-10 IMAGING — US US RENAL
1 series · 13 of 25 positions shown · non-contrast
Comparison: CT abdomen and pelvis August 07, 2017

CLINICAL DATA: Left flank pain for 2 weeks

EXAM:
RENAL / URINARY TRACT ULTRASOUND COMPLETE

[Series 1: us renal · 0.26mm/px · 13 of 55 slices shown]
[im 1/55]
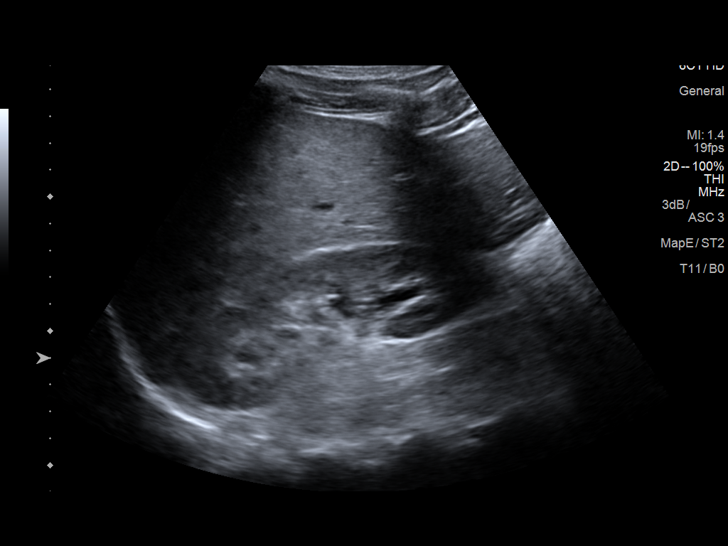
[im 5/55]
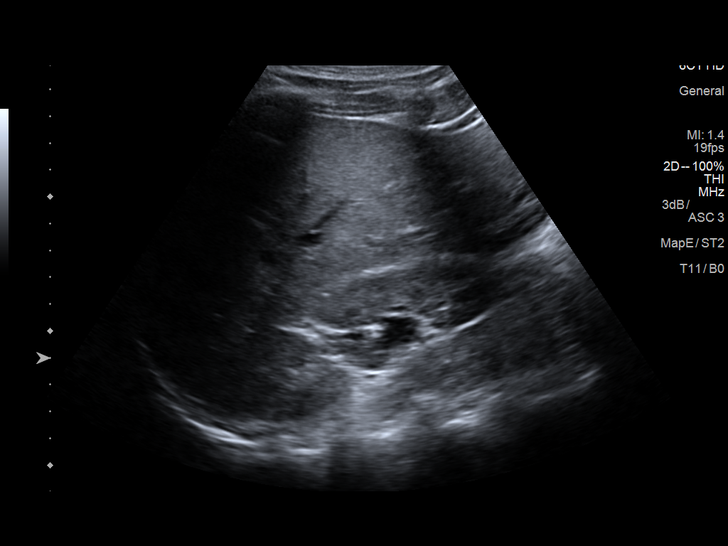
[im 10/55]
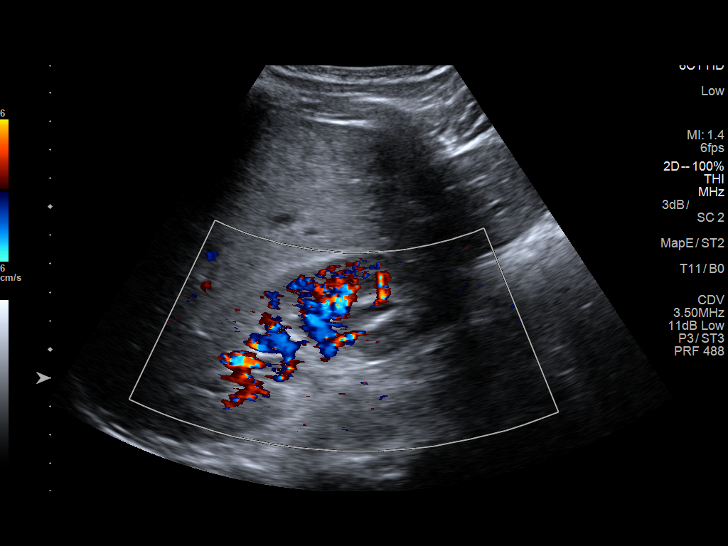
[im 14/55]
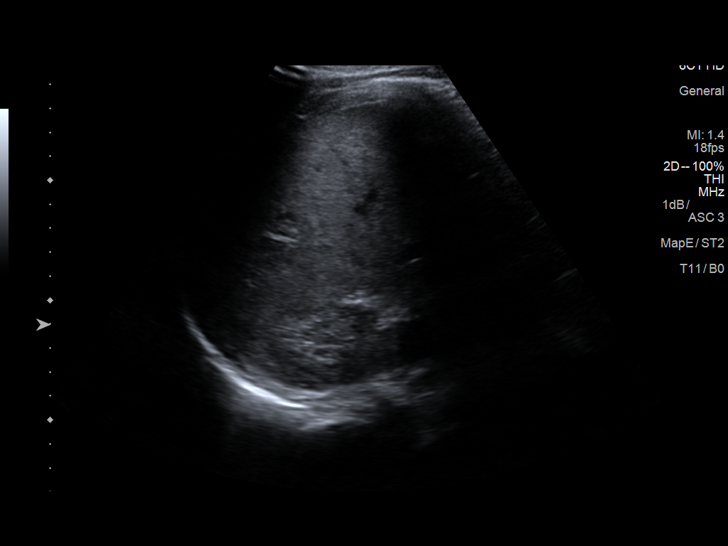
[im 19/55]
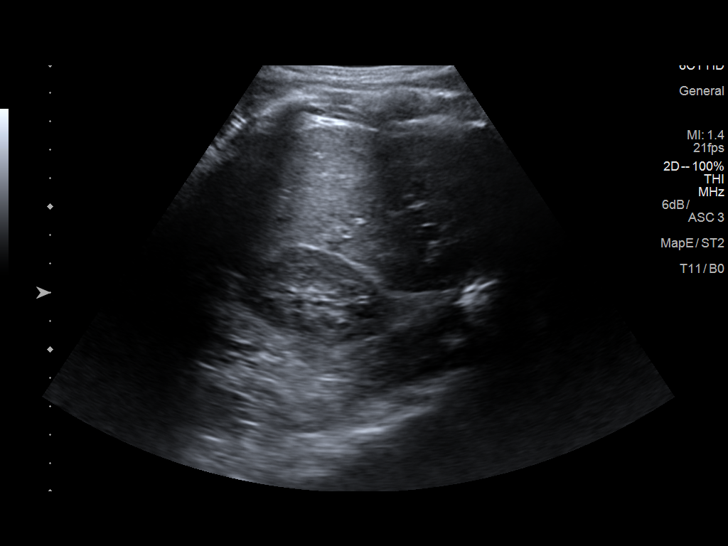
[im 23/55]
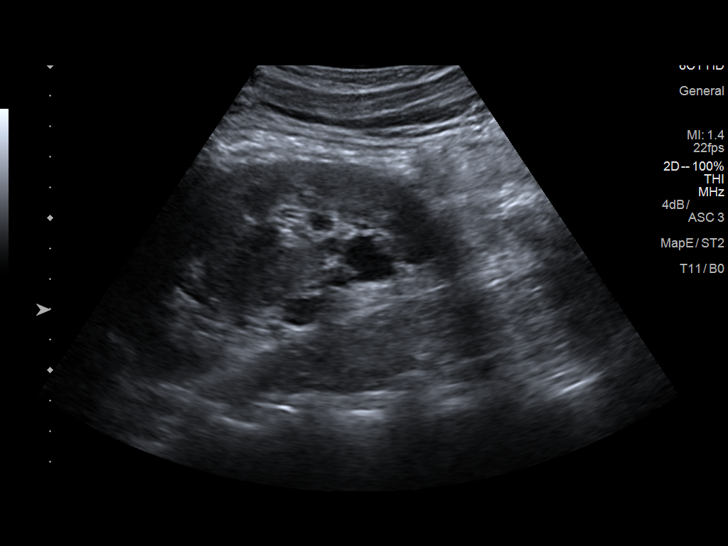
[im 28/55]
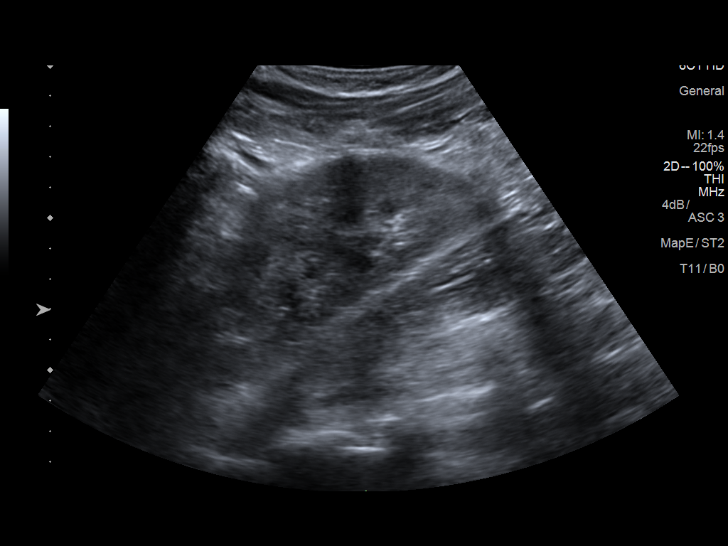
[im 32/55]
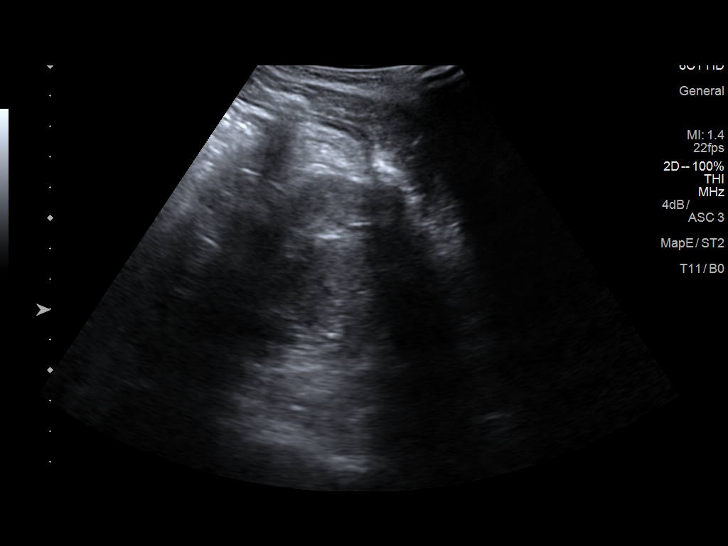
[im 37/55]
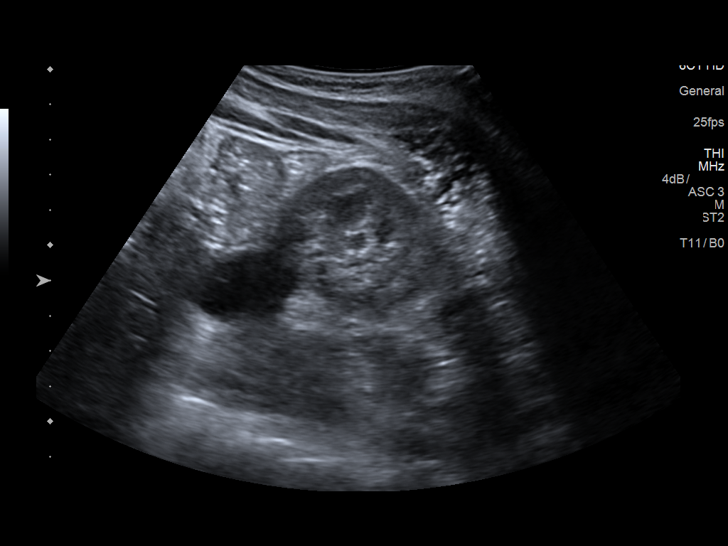
[im 41/55]
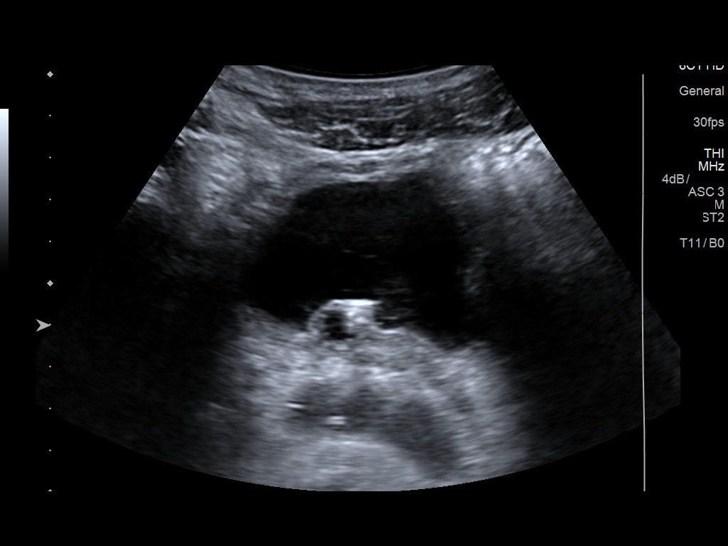
[im 46/55]
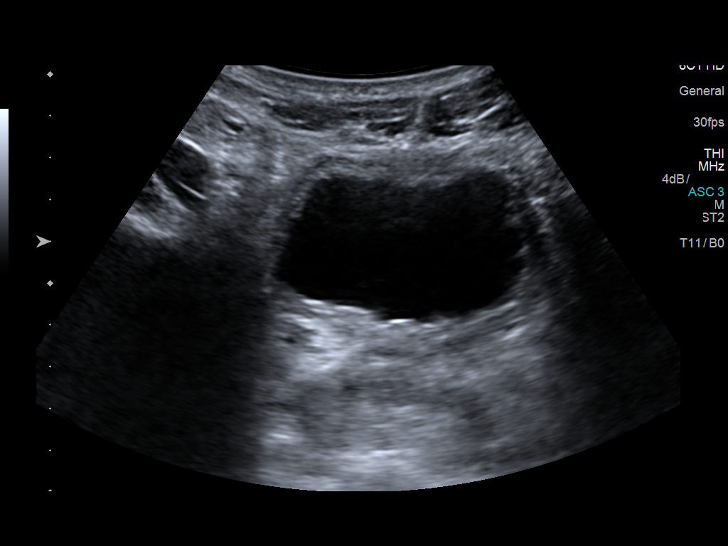
[im 50/55]
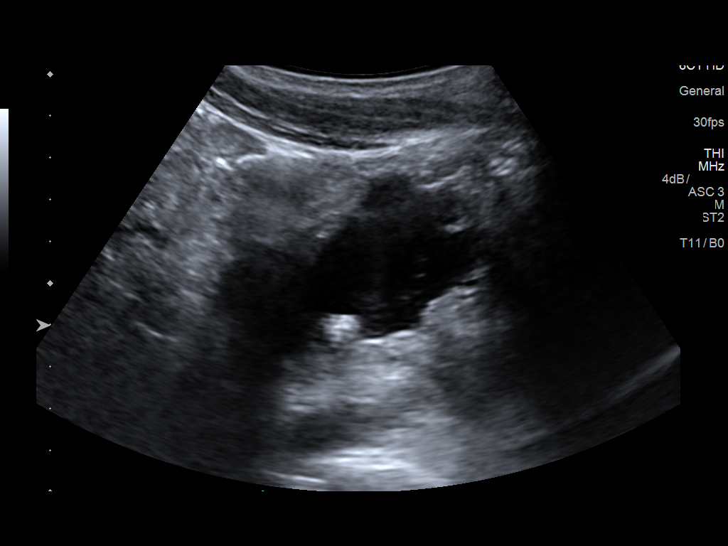
[im 55/55]
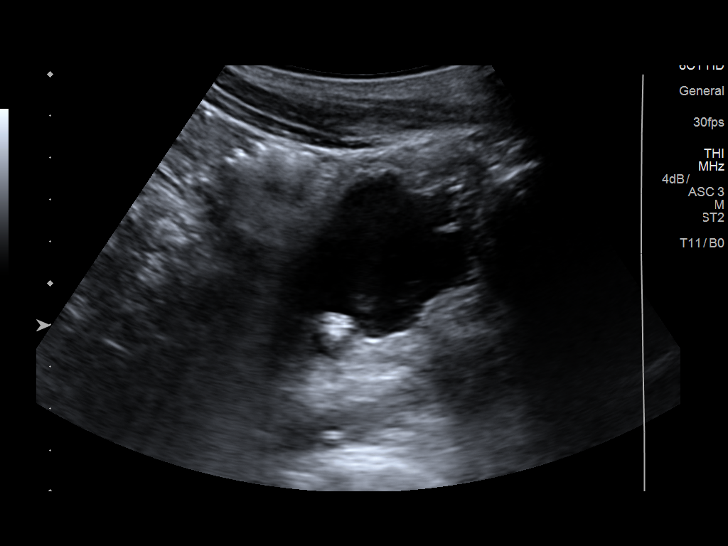

[13 of 25 positions shown; findings below may reference images not displayed]

FINDINGS: Right Kidney:

Length: 12.7 cm. Echogenicity and renal cortical thickness are
within normal limits. No mass or perinephric fluid visualized. There
is mild fullness of the right renal collecting system. No
sonographically demonstrable calculus or ureterectasis evident on
either side.

Left Kidney:

Length: 12.1 cm. Echogenicity and renal cortical thickness are
within normal limits. No mass or perinephric fluid visualized. There
is mild fullness of the left renal collecting system, slightly
greater than on the right side. No sonographically demonstrable
calculus or ureterectasis is evident.

Bladder:

There is a 9 mm calculus in the posterior aspect of the urinary
bladder near the midline. There is a questionable distal
ureterocele, essentially midline. There is mild wall thickening in
the posterior bladder. No ureteral flow could be seen using color
Doppler ultrasound in the bladder.
IMPRESSION: 1. Slight fullness of each renal collecting system, slightly more on
the left than on the right. Kidneys bilaterally otherwise appear
unremarkable.

2. Probable ureterocele posteriorly within the bladder is
essentially the midline with a 9 mm calculus in this area. Mild
posterior bladder wall thickening, suggesting that there may be a
degree of cystitis.

Comment: It may be prudent to consider noncontrast enhanced CT to
assess for possible distal ureteral calculus, as well as tunnel
potentially more precisely localize the location of the calculus in
the posterior bladder. This calculus may reside in a rather medially
placed ureterovesical junction.

## 2023-04-28 ENCOUNTER — Other Ambulatory Visit: Payer: Self-pay

## 2023-04-28 ENCOUNTER — Emergency Department (HOSPITAL_COMMUNITY)

## 2023-04-28 ENCOUNTER — Emergency Department (HOSPITAL_COMMUNITY)
Admission: EM | Admit: 2023-04-28 | Discharge: 2023-04-28 | Disposition: A | Discharge status: Home / Self Care | Attending: Emergency Medicine | Admitting: Emergency Medicine

## 2023-04-28 ENCOUNTER — Encounter (HOSPITAL_COMMUNITY): Payer: Self-pay

## 2023-04-28 DIAGNOSIS — Z79899 Other long term (current) drug therapy: Secondary | ICD-10-CM | POA: Insufficient documentation

## 2023-04-28 DIAGNOSIS — E039 Hypothyroidism, unspecified: Secondary | ICD-10-CM | POA: Insufficient documentation

## 2023-04-28 DIAGNOSIS — Z8673 Personal history of transient ischemic attack (TIA), and cerebral infarction without residual deficits: Secondary | ICD-10-CM | POA: Diagnosis not present

## 2023-04-28 DIAGNOSIS — M25551 Pain in right hip: Secondary | ICD-10-CM | POA: Diagnosis present

## 2023-04-28 DIAGNOSIS — M25552 Pain in left hip: Secondary | ICD-10-CM | POA: Diagnosis not present

## 2023-04-28 MED ORDER — ETODOLAC 400 MG PO TABS: 400.0000 mg | ORAL_TABLET | Freq: Two times a day (BID) | ORAL | 0 refills | Status: AC

## 2023-04-28 MED ORDER — KETOROLAC TROMETHAMINE 15 MG/ML IJ SOLN
15.0000 mg | Freq: Once | INTRAMUSCULAR | Status: AC
  Administered 2023-04-28: 15 mg via INTRAMUSCULAR
  Filled 2023-04-28: qty 1

## 2023-04-28 NOTE — ED Triage Notes (Signed)
 Pt arrived reporting ble hip pain. No injury. Denies numbness or tinging. Patient states this has been a issue, went and saw bone doctor and was given 2 injections, pain not improving.

## 2023-04-28 NOTE — Discharge Instructions (Signed)
 Follow-up with your orthopedist.  Return for any emergent symptoms.  Take Lodine  as prescribed.  Do not combine this with ibuprofen  or other anti-inflammatory medications.  Continue taking your steroid course that was prescribed.

## 2023-04-28 NOTE — ED Notes (Signed)
 Pt requesting pain medication, Ceclia Cohens, [place] notified

## 2023-04-28 NOTE — ED Provider Triage Note (Signed)
 Emergency Medicine Provider Triage Evaluation Note  Zared Knoth , a 58 y.o. male  was evaluated in triage.  Pt complains of bilateral hip pain.  He states he has history of issues with his hip and recently saw an orthopedist who gave him injections into bilateral hips.  Pain started today.  States he initially had some relief..  Review of Systems  Positive: As above Negative: As above  Physical Exam  BP 135/87 (BP Location: Left Arm)   Pulse 65   Temp 98.2 F (36.8 C) (Oral)   Resp 16   SpO2 100%  Gen:   Awake, no distress   Resp:  Normal effort  MSK:   Moves extremities without difficulty  Other:    Medical Decision Making  Medically screening exam initiated at 4:12 PM.  Appropriate orders placed.  Serjio Deupree was informed that the remainder of the evaluation will be completed by another provider, this initial triage assessment does not replace that evaluation, and the importance of remaining in the ED until their evaluation is complete.     Lucina Sabal, PA-C 04/28/23 [phone]

## 2023-04-28 NOTE — ED Provider Notes (Signed)
 [facility] AT [facility] Provider Note   CSN: [id number] Arrival date & time: 04/28/23  1532     History  Chief Complaint  Patient presents with   Hip Pain    George Floyd is a 58 y.o. male.  58 year old male presents today for concern of bilateral hip pain.  He states he recently had injection to both of the hips.  Which initially gave him relief but today started having the pain.  He states he did not reach out to his orthopedist.  He states he is due to see him next month.  Denies any injuries.  No other complaints.  The history is provided by the patient. No language interpreter was used.       Home Medications Prior to Admission medications   Medication Sig Start Date End Date Taking? Authorizing Provider  etodolac  (LODINE ) 400 MG tablet Take 1 tablet (400 mg total) by mouth 2 (two) times daily. 04/28/23  Yes Amsi Grimley, PA-C  Carboxymethylcellul-Glycerin (CLEAR EYES FOR DRY EYES) 1-0.25 % SOLN Apply 1 drop to eye daily as needed (dry eyes).    [provider]  ibuprofen  (ADVIL ,MOTRIN ) 400 MG tablet Take 1 tablet (400 mg total) by mouth every 6 (six) hours as needed. 08/17/17   Deatra Face, MD  ibuprofen  (ADVIL ,MOTRIN ) 800 MG tablet Take 1 tablet (800 mg total) by mouth every 8 (eight) hours as needed. 11/30/17   Cornett, Andy Bannister, MD  LEVOTHYROXINE  SODIUM PO Take by mouth daily before breakfast.    [provider]  ondansetron  (ZOFRAN  ODT) 8 MG disintegrating tablet Take 1 tablet (8 mg total) by mouth every 8 (eight) hours as needed for nausea. 08/17/17   Deatra Face, MD  oxyCODONE  (OXY IR/ROXICODONE ) 5 MG immediate release tablet Take 1 tablet (5 mg total) by mouth every 6 (six) hours as needed for severe pain. 11/30/17   Cornett, Andy Bannister, MD  tiZANidine (ZANAFLEX) 4 MG tablet Take 4 mg by mouth once.    [provider]      Allergies    Patient has no known allergies.    Review of Systems   Review of  Systems  Musculoskeletal:  Positive for arthralgias.    Physical Exam Updated Vital Signs BP 135/87 (BP Location: Left Arm)   Pulse 65   Temp 98.2 F (36.8 C) (Oral)   Resp 16   SpO2 100%  Physical Exam Vitals and nursing note reviewed.  Constitutional:      General: He is not in acute distress.    Appearance: Normal appearance. He is not ill-appearing.  HENT:     Head: Normocephalic and atraumatic.     Nose: Nose normal.  Eyes:     Conjunctiva/sclera: Conjunctivae normal.  Pulmonary:     Effort: Pulmonary effort is normal. No respiratory distress.  Musculoskeletal:        General: No deformity. Normal range of motion.     Cervical back: Normal range of motion.     Comments: Good range of motion in both hips.  Spine without tenderness palpation.  Spine well aligned.  Ambulates without much difficulty.  Skin:    Findings: No rash.  Neurological:     Mental Status: He is alert.     ED Results / Procedures / Treatments   Labs (all labs ordered are listed, but only abnormal results are displayed) Labs Reviewed - No data to display  EKG None  Radiology DG Hip Unilat W or Wo Pelvis 2-3 Views  Left Result Date: 04/28/2023 CLINICAL DATA:  Bilateral hip pain EXAM: DG HIP (WITH OR WITHOUT PELVIS) 2-3V LEFT COMPARISON:  None Available. FINDINGS: There is no evidence of hip fracture or dislocation. There is no evidence of arthropathy or other focal bone abnormality. IMPRESSION: Negative. Electronically Signed   By: [name] M.D.   On: 04/28/2023 16:52   DG Hip Unilat W or Wo Pelvis 2-3 Views Right Result Date: 04/28/2023 CLINICAL DATA:  Right hip pain EXAM: DG HIP (WITH OR WITHOUT PELVIS) 2-3V RIGHT COMPARISON:  None Available. FINDINGS: There is no evidence of hip fracture or dislocation. There is no evidence of arthropathy or other focal bone abnormality. IMPRESSION: Negative. Electronically Signed   By: [name] M.D.   On: 04/28/2023 16:52    Procedures Procedures     Medications Ordered in ED Medications  ketorolac  (TORADOL ) 15 MG/ML injection 15 mg (15 mg Intramuscular Given 04/28/23 2029)    ED Course/ Medical Decision Making/ A&P                                 Medical Decision Making Amount and/or Complexity of Data Reviewed Radiology: ordered.  Risk Prescription drug management.   Medical Decision Making / ED Course   This patient presents to the ED for concern of hip pain, this involves an extensive number of treatment options, and is a complaint that carries with it a high risk of complications and morbidity.  The differential diagnosis includes arthritis, sciatica, fracture  MDM: 58 year old male presents today for concern of bilateral hip pain.  X-rays obtained.  No acute finding.  Toradol  given.  Lodine  prescribed.  He will follow-up with his orthopedist.  Return precautions discussed.  Patient voices understanding and is in agreement with plan.  Lab Tests: -I ordered, reviewed, and interpreted labs.   The pertinent results include:   Labs Reviewed - No data to display    EKG  EKG Interpretation Date/Time:    Ventricular Rate:    PR Interval:    QRS Duration:    QT Interval:    QTC Calculation:   R Axis:      Text Interpretation:           Imaging Studies ordered: I ordered imaging studies including right hip x-ray, left hip x-ray I independently visualized and interpreted imaging. I agree with the radiologist interpretation   Medicines ordered and prescription drug management: Meds ordered this encounter  Medications   ketorolac  (TORADOL ) 15 MG/ML injection 15 mg   etodolac  (LODINE ) 400 MG tablet    Sig: Take 1 tablet (400 mg total) by mouth 2 (two) times daily.    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  Return precautions discussed.   Final Clinical Impression(s) / ED Diagnoses Final diagnoses:  Acute pain of both hips    Rx /  DC Orders ED Discharge Orders          Ordered    etodolac  (LODINE ) 400 MG tablet  2 times daily        04/28/23 2103              Lucina Sabal, PA-C 04/28/23 2106    Almond Army, MD 04/28/23 2333

## 2023-10-25 ENCOUNTER — Ambulatory Visit (HOSPITAL_COMMUNITY)
Admission: RE | Admit: 2023-10-25 | Discharge: 2023-10-25 | Disposition: A | Discharge status: Home / Self Care | Source: Ambulatory Visit | Attending: Family Medicine | Admitting: Family Medicine

## 2023-10-25 ENCOUNTER — Other Ambulatory Visit (HOSPITAL_COMMUNITY): Payer: Self-pay | Admitting: Family Medicine

## 2023-10-25 DIAGNOSIS — R6884 Jaw pain: Secondary | ICD-10-CM | POA: Diagnosis present
